# Patient Record
Sex: Female | Born: 1950 | Race: White | Hispanic: No | Smoking: Current every day smoker
Health system: Southern US, Community
[De-identification: ages and names within clinical notes are randomized; demographics above are authoritative.]

## PROBLEM LIST (undated history)

## (undated) DIAGNOSIS — E7800 Pure hypercholesterolemia, unspecified: Secondary | ICD-10-CM

## (undated) DIAGNOSIS — E039 Hypothyroidism, unspecified: Secondary | ICD-10-CM

## (undated) DIAGNOSIS — J189 Pneumonia, unspecified organism: Secondary | ICD-10-CM

## (undated) DIAGNOSIS — I10 Essential (primary) hypertension: Secondary | ICD-10-CM

## (undated) DIAGNOSIS — D352 Benign neoplasm of pituitary gland: Secondary | ICD-10-CM

## (undated) DIAGNOSIS — M797 Fibromyalgia: Secondary | ICD-10-CM

## (undated) DIAGNOSIS — G8929 Other chronic pain: Secondary | ICD-10-CM

## (undated) DIAGNOSIS — M549 Dorsalgia, unspecified: Secondary | ICD-10-CM

## (undated) DIAGNOSIS — I214 Non-ST elevation (NSTEMI) myocardial infarction: Secondary | ICD-10-CM

## (undated) HISTORY — PX: BACK SURGERY: SHX140

## (undated) HISTORY — PX: CARPAL TUNNEL RELEASE: SHX101

## (undated) HISTORY — PX: LUMBAR DISC SURGERY: SHX700

## (undated) HISTORY — PX: BREAST CYST EXCISION: SHX579

---
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
## 2009-09-13 HISTORY — PX: TRANSPHENOIDAL PITUITARY RESECTION: SHX2572

## 2018-12-14 ENCOUNTER — Inpatient Hospital Stay (HOSPITAL_COMMUNITY)
Admission: EM | Admit: 2018-12-14 | Discharge: 2018-12-16 | DRG: 247 | Disposition: A | Discharge status: Home / Self Care | Payer: Medicare HMO | Attending: Cardiology | Admitting: Cardiology

## 2018-12-14 ENCOUNTER — Encounter (HOSPITAL_COMMUNITY): Payer: Self-pay | Admitting: *Deleted

## 2018-12-14 ENCOUNTER — Emergency Department (HOSPITAL_COMMUNITY): Payer: Medicare HMO

## 2018-12-14 ENCOUNTER — Other Ambulatory Visit: Payer: Self-pay

## 2018-12-14 DIAGNOSIS — Z7952 Long term (current) use of systemic steroids: Secondary | ICD-10-CM

## 2018-12-14 DIAGNOSIS — Z886 Allergy status to analgesic agent status: Secondary | ICD-10-CM | POA: Diagnosis not present

## 2018-12-14 DIAGNOSIS — E237 Disorder of pituitary gland, unspecified: Secondary | ICD-10-CM | POA: Diagnosis not present

## 2018-12-14 DIAGNOSIS — F17210 Nicotine dependence, cigarettes, uncomplicated: Secondary | ICD-10-CM | POA: Diagnosis present

## 2018-12-14 DIAGNOSIS — Z7902 Long term (current) use of antithrombotics/antiplatelets: Secondary | ICD-10-CM

## 2018-12-14 DIAGNOSIS — E785 Hyperlipidemia, unspecified: Secondary | ICD-10-CM

## 2018-12-14 DIAGNOSIS — I2584 Coronary atherosclerosis due to calcified coronary lesion: Secondary | ICD-10-CM | POA: Diagnosis present

## 2018-12-14 DIAGNOSIS — Z8639 Personal history of other endocrine, nutritional and metabolic disease: Secondary | ICD-10-CM

## 2018-12-14 DIAGNOSIS — Z8249 Family history of ischemic heart disease and other diseases of the circulatory system: Secondary | ICD-10-CM

## 2018-12-14 DIAGNOSIS — Z720 Tobacco use: Secondary | ICD-10-CM | POA: Diagnosis not present

## 2018-12-14 DIAGNOSIS — Z955 Presence of coronary angioplasty implant and graft: Secondary | ICD-10-CM

## 2018-12-14 DIAGNOSIS — Z885 Allergy status to narcotic agent status: Secondary | ICD-10-CM

## 2018-12-14 DIAGNOSIS — Z79899 Other long term (current) drug therapy: Secondary | ICD-10-CM

## 2018-12-14 DIAGNOSIS — I2510 Atherosclerotic heart disease of native coronary artery without angina pectoris: Secondary | ICD-10-CM | POA: Diagnosis present

## 2018-12-14 DIAGNOSIS — I214 Non-ST elevation (NSTEMI) myocardial infarction: Secondary | ICD-10-CM | POA: Diagnosis present

## 2018-12-14 DIAGNOSIS — R001 Bradycardia, unspecified: Secondary | ICD-10-CM | POA: Diagnosis present

## 2018-12-14 DIAGNOSIS — Z79890 Hormone replacement therapy (postmenopausal): Secondary | ICD-10-CM | POA: Diagnosis not present

## 2018-12-14 DIAGNOSIS — I340 Nonrheumatic mitral (valve) insufficiency: Secondary | ICD-10-CM | POA: Diagnosis not present

## 2018-12-14 HISTORY — DX: Pure hypercholesterolemia, unspecified: E78.00

## 2018-12-14 HISTORY — DX: Pneumonia, unspecified organism: J18.9

## 2018-12-14 HISTORY — DX: Non-ST elevation (NSTEMI) myocardial infarction: I21.4

## 2018-12-14 HISTORY — DX: Other chronic pain: G89.29

## 2018-12-14 HISTORY — DX: Essential (primary) hypertension: I10

## 2018-12-14 HISTORY — DX: Fibromyalgia: M79.7

## 2018-12-14 HISTORY — DX: Hypothyroidism, unspecified: E03.9

## 2018-12-14 HISTORY — DX: Benign neoplasm of pituitary gland: D35.2

## 2018-12-14 HISTORY — DX: Dorsalgia, unspecified: M54.9

## 2018-12-14 LAB — BASIC METABOLIC PANEL
Anion gap: 11 (ref 5–15)
BUN: 12 mg/dL (ref 8–23)
CO2: 24 mmol/L (ref 22–32)
Calcium: 8.8 mg/dL — ABNORMAL LOW (ref 8.9–10.3)
Chloride: 98 mmol/L (ref 98–111)
Creatinine, Ser: 0.87 mg/dL (ref 0.44–1.00)
GFR calc Af Amer: >60 mL/min (ref >60)
Glucose, Bld: 107 mg/dL — ABNORMAL HIGH (ref 70–99)
Potassium: 4.2 mmol/L (ref 3.5–5.1)
SODIUM: 133 mmol/L — AB (ref 135–145)

## 2018-12-14 LAB — TROPONIN I: TROPONIN I: 12.43 ng/mL — AB (ref <0.03)

## 2018-12-14 LAB — PROTIME-INR
INR: 0.93
INR: 0.95
PROTHROMBIN TIME: 12.3 s (ref 11.4–15.2)
Prothrombin Time: 12.6 seconds (ref 11.4–15.2)

## 2018-12-14 LAB — CBC
HCT: 46.9 % — ABNORMAL HIGH (ref 36.0–46.0)
Hemoglobin: 15.8 g/dL — ABNORMAL HIGH (ref 12.0–15.0)
MCH: 30.2 pg (ref 26.0–34.0)
MCHC: 33.7 g/dL (ref 30.0–36.0)
MCV: 89.5 fL (ref 80.0–100.0)
Platelets: 325 10*3/uL (ref 150–400)
RBC: 5.24 MIL/uL — ABNORMAL HIGH (ref 3.87–5.11)
RDW: 13 % (ref 11.5–15.5)
WBC: 10.3 10*3/uL (ref 4.0–10.5)
nRBC: 0 % (ref 0.0–0.2)

## 2018-12-14 LAB — HEMOGLOBIN A1C
Hgb A1c MFr Bld: 5.2 % (ref 4.8–5.6)
Mean Plasma Glucose: 102.54 mg/dL

## 2018-12-14 LAB — APTT: APTT: 31 s (ref 24–36)

## 2018-12-14 MED ORDER — INFLUENZA VAC SPLIT HIGH-DOSE 0.5 ML IM SUSY
0.5000 mL | PREFILLED_SYRINGE | INTRAMUSCULAR | Status: DC
  Filled 2018-12-14 (×2): qty 0.5

## 2018-12-14 MED ORDER — ATORVASTATIN CALCIUM 80 MG PO TABS
80.0000 mg | ORAL_TABLET | Freq: Every day | ORAL | Status: DC
  Filled 2018-12-14: qty 1

## 2018-12-14 MED ORDER — DESMOPRESSIN ACETATE 0.1 MG PO TABS
0.1000 mg | ORAL_TABLET | Freq: Two times a day (BID) | ORAL | Status: DC
  Administered 2018-12-14 – 2018-12-16 (×4): 0.1 mg via ORAL
  Filled 2018-12-14 (×4): qty 1

## 2018-12-14 MED ORDER — LEVOTHYROXINE SODIUM 137 MCG PO TABS
137.0000 ug | ORAL_TABLET | ORAL | Status: DC
  Administered 2018-12-15 – 2018-12-16 (×2): 137 ug via ORAL
  Filled 2018-12-14 (×2): qty 1

## 2018-12-14 MED ORDER — PREDNISONE 1 MG PO TABS
4.0000 mg | ORAL_TABLET | Freq: Every day | ORAL | Status: DC
  Administered 2018-12-15 – 2018-12-16 (×2): 4 mg via ORAL
  Filled 2018-12-14 (×2): qty 4

## 2018-12-14 MED ORDER — NITROGLYCERIN IN D5W 200-5 MCG/ML-% IV SOLN
0.0000 ug/min | INTRAVENOUS | Status: DC
  Administered 2018-12-14: 5 ug/min via INTRAVENOUS
  Administered 2018-12-15: 10 ug/min via INTRAVENOUS
  Filled 2018-12-14: qty 250

## 2018-12-14 MED ORDER — VITAMIN B-12 1000 MCG PO TABS
1000.0000 ug | ORAL_TABLET | Freq: Every day | ORAL | Status: DC
  Administered 2018-12-16: 1000 ug via ORAL
  Filled 2018-12-14 (×2): qty 1

## 2018-12-14 MED ORDER — HEPARIN BOLUS VIA INFUSION
4000.0000 [IU] | Freq: Once | INTRAVENOUS | Status: AC
  Administered 2018-12-14: 4000 [IU] via INTRAVENOUS
  Filled 2018-12-14: qty 4000

## 2018-12-14 MED ORDER — LEVOTHYROXINE SODIUM 25 MCG PO TABS: 68.5000 ug | ORAL_TABLET | ORAL | Status: DC

## 2018-12-14 MED ORDER — NITROGLYCERIN 0.4 MG SL SUBL: 0.4000 mg | SUBLINGUAL_TABLET | SUBLINGUAL | Status: DC | PRN

## 2018-12-14 MED ORDER — ONDANSETRON HCL 4 MG/2ML IJ SOLN: 4.0000 mg | Freq: Four times a day (QID) | INTRAMUSCULAR | Status: DC | PRN

## 2018-12-14 MED ORDER — HEPARIN (PORCINE) 25000 UT/250ML-% IV SOLN
800.0000 [IU]/h | INTRAVENOUS | Status: DC
  Administered 2018-12-14: 800 [IU]/h via INTRAVENOUS
  Filled 2018-12-14: qty 250

## 2018-12-14 NOTE — Progress Notes (Signed)
ANTICOAGULATION CONSULT NOTE - Initial Consult  Pharmacy Consult for Heparin Indication: chest pain/ACS  Allergies  Allergen Reactions  . Aspirin   . Lortab [Hydrocodone-Acetaminophen]   . Percocet [Oxycodone-Acetaminophen]     Patient Measurements: Height: 5\' 1"  (154.9 cm) Weight: 154 lb (69.9 kg) IBW/kg (Calculated) : 47.8 Heparin Dosing Weight: 62.8 kg  Vital Signs: Temp: 97.9 F (36.6 C) (01/01 1950) Temp Source: Oral (01/01 1950) BP: 101/67 (01/01 2000) Pulse Rate: 69 (01/01 2000)  Labs: Recent Labs    12/14/18 1839  HGB 15.8*  HCT 46.9*  PLT 325  CREATININE 0.87  TROPONINI 12.43*    Estimated Creatinine Clearance: 56.8 mL/min (by C-G formula based on SCr of 0.87 mg/dL).   Medical History: Past Medical History:  Diagnosis Date  . Adenoma     Medications:  PTA medlist pending  Assessment: 68 yo F presented to ED with CP, + L arm radiation, since last PM.  Goal of Therapy:  Heparin level 0.3-0.7 units/ml Monitor platelets by anticoagulation protocol: Yes   Plan:  STAT aPTT, INR Heparin 4000 unit IV bolus x 1 Heparin infusion at 800 units/hr Heparin level in 6 hours Heparin level and CBC daily while on heparin Follow-up PTA medlist  Neeka Urista, Pharm.D., BCPS Clinical Pharmacist  **Pharmacist phone directory can now be found on [email or web address] ([email or web address]).  Listed under [place].  12/14/2018 8:11 PM

## 2018-12-14 NOTE — H&P (Addendum)
History & Physical    Patient ID: Bridget Navarro MRN: [id number], DOB/AGE: [birth year]   Admit date: 12/14/2018   Primary Physician: No primary care provider on file. Primary Cardiologist: No primary care provider on file.  Patient Profile    Bridget Navarro is a 68 year old woman with HL, long standing tobacco abuse, history of pituitary adenoma s/p resection who presents after sudden onset chest pain at 0230 this morning, found to have NSTEMI.   Past Medical History    Past Medical History:  Diagnosis Date  . Adenoma     History reviewed. No pertinent surgical history.   Allergies  Allergies  Allergen Reactions  . Aspirin Anaphylaxis, Shortness Of Breath and Swelling    Body and throat both swell  . Tape Hives, Rash and Other (See Comments)    NO CLEAR, "PLASTIC" TAPE!!! SKIN PEELED OFF AND THIS CAUSED BLISTERS!!!  . Codeine Itching and Other (See Comments)    Severe itching and makes the patient "hyper"  . Lortab [Hydrocodone-Acetaminophen] Itching and Other (See Comments)    Severe itching and makes the patient "hyper"  . Percocet [Oxycodone-Acetaminophen] Itching and Other (See Comments)    Severe itching and makes the patient "hyper"    History of Present Illness    Bridget Navarro is a 68 year old woman with HL, long standing tobacco abuse, history of pituitary adenoma s/p resection who presents after sudden onset chest pain at 0230 this morning, found to have NSTEMI.  The patient reports she has been in her usual state of health until she was woken from sleep at 0230 this morning due to >10/10 chest pressure. Patient reports it felt like someone was sitting on her chest. She denies any prior episodes of chest pain prior to today. No shortness of breath but does report diaphoresis, nausea and dizziness. Felt so unwell that she didn't think she could get out of bed. Pain eased off some during the day but due to ongoing 7/10 pain, she presented to urgent care today. At urgent  care, was noted that ECG showed inferior Q waves and thus she was sent to the [facility] ED.   Upon arrival, the patient was hemodynamically stable with HR 70, BP 105/70, chest pain 7/10 in intensity. ECG again showed inferior Q waves and sub 70mm, upsloping STE in lead III only. Laboratory evaluation significant for initial troponin I of 12.4. She was started on heparin gtt and nitro gtt. She was not given aspirin due to a history of hives and significant swelling that the patient reports with prior administration. At the time of my interview, patient reports that chest pain is significantly improved and is at 2/10 in intensity on 24mcg nitro infusion. She denies palpitations, shortness of breath, orthopnea, PND, LEE, syncope.  Home Medications    Prior to Admission medications   Medication Sig Start Date End Date Taking? Authorizing Provider  clopidogrel (PLAVIX) 75 MG tablet Take 75 mg by mouth daily.   Yes [provider]  Cranberry 425 MG CAPS Take 425 mg by mouth daily.   Yes [provider]  Cyanocobalamin (VITAMIN B12) 1000 MCG TBCR Take 1,000 mcg by mouth daily.    Yes [provider]  desmopressin (DDAVP) 0.1 MG tablet Take 0.1 mg by mouth 2 (two) times daily.   Yes [provider]  diclofenac (VOLTAREN) 75 MG EC tablet Take 75 mg by mouth 2 (two) times daily.   Yes [provider]  fexofenadine-pseudoephedrine (ALLEGRA-D) 60-120  MG 12 hr tablet Take 1 tablet by mouth at bedtime.   Yes [provider]  levothyroxine (SYNTHROID, LEVOTHROID) 137 MCG tablet Take 68.5-137 mcg by mouth See admin instructions. Take 137 mcg by mouth before breakfast on Sun/Tues/Wed/Thurs/Fri/Sat and 68.5 mcg on Mon   Yes [provider]  Melatonin 10 MG TABS Take 20 mg by mouth at bedtime.   Yes [provider]  pravastatin (PRAVACHOL) 20 MG tablet Take 20 mg by mouth daily.   Yes [provider]  predniSONE (DELTASONE) 1 MG tablet  Take 4 mg by mouth daily with breakfast.   Yes [provider]    Family History    Family History  Problem Relation Age of Onset  . Coronary artery disease Father   . Coronary artery disease Paternal Grandfather     Social History    Social History   Socioeconomic History  . Marital status: Not on file    Spouse name: Not on file  . Number of children: Not on file  . Years of education: Not on file  . Highest education level: Not on file  Occupational History  . Not on file  Social Needs  . Financial resource strain: Not on file  . Food insecurity:    Worry: Not on file    Inability: Not on file  . Transportation needs:    Medical: Not on file    Non-medical: Not on file  Tobacco Use  . Smoking status: Current Every Day Smoker    Packs/day: 1.00  . Smokeless tobacco: Never Used  Substance and Sexual Activity  . Alcohol use: Not on file  . Drug use: Not on file  . Sexual activity: Yes  Lifestyle  . Physical activity:    Days per week: Not on file    Minutes per session: Not on file  . Stress: Not on file  Relationships  . Social connections:    Talks on phone: Not on file    Gets together: Not on file    Attends religious service: Not on file    Active member of club or organization: Not on file    Attends meetings of clubs or organizations: Not on file    Relationship status: Not on file  . Intimate partner violence:    Fear of current or ex partner: Not on file    Emotionally abused: Not on file    Physically abused: Not on file    Forced sexual activity: Not on file  Other Topics Concern  . Not on file  Social History Narrative  . Not on file     Review of Systems    General:  No chills, fever, night sweats or weight changes.  Cardiovascular: As per HPI Dermatological: No rash, lesions/masses Respiratory: No cough, dyspnea Urologic: No hematuria, dysuria Abdominal:   No nausea, vomiting, diarrhea, bright red blood per rectum, melena,  or hematemesis Neurologic:  No visual changes, wkns, changes in mental status. All other systems reviewed and are otherwise negative except as noted above.  Physical Exam    Blood pressure 123/72, pulse 68, temperature 97.9 F (36.6 C), temperature source Oral, resp. rate (!) 22, height 5\' 1"  (1.549 m), weight 69.9 kg, SpO2 96 %.  General: Pleasant, NAD Psych: Normal affect. Neuro: Alert and oriented X 3. Moves all extremities spontaneously. HEENT: Normal  Neck: Supple without bruits or JVD. Lungs:  Resp regular and unlabored, CTA. Heart: RRR no s3, s4, or murmurs. Abdomen: Soft, non-tender,  non-distended, BS + x 4.  Extremities: No clubbing, cyanosis or edema. DP/PT/Radials 2+ and equal bilaterally.  Labs    Troponin (Point of Care Test) No results for input(s): TROPIPOC in the last 72 hours. Recent Labs    12/14/18 1839  TROPONINI 12.43*   Lab Results  Component Value Date   WBC 10.3 12/14/2018   HGB 15.8 (H) 12/14/2018   HCT 46.9 (H) 12/14/2018   MCV 89.5 12/14/2018   PLT 325 12/14/2018    Recent Labs  Lab 12/14/18 1839  NA 133*  K 4.2  CL 98  CO2 24  BUN 12  CREATININE 0.87  CALCIUM 8.8*  GLUCOSE 107*   No results found for: CHOL, HDL, LDLCALC, TRIG No results found for: [facility]   Radiology Studies    Dg Chest 2 View  Result Date: 12/14/2018 CLINICAL DATA:  Chest pain EXAM: CHEST - 2 VIEW COMPARISON:  None. FINDINGS: Normal heart size. Normal mediastinal contour. No pneumothorax. No pleural effusion. No pulmonary edema. Minimal scarring at anterior left lung base. No acute consolidative airspace disease. IMPRESSION: No active cardiopulmonary disease. Electronically Signed   By: Ilona Sorrel M.D.   On: 12/14/2018 19:34    ECG & Cardiac Imaging    Sinus rhythm. Normal rate. Inferior Q waves. Sub 1 mm, up sloping ST elevation in lead III only - personally reviewed.  Assessment & Plan    Bridget Navarro is a 68 year old woman with HL, long standing tobacco  abuse, history of pituitary adenoma s/p resection who presents after sudden onset chest pain at 0230 this morning, found to have NSTEMI.   # NSTEMI # HL Patient with typical description of chest pain and troponin significantly elevated consistent with NSTEMI. ECG without acute ST elevation but with chest pain ongoing at 2/10, on minimal dose of nitroglycerin. Discussed with patient the importance of notifying staff if worsening of pain. If recurrence of increased pain or increasing requirements for nitroglycerin would have low threshold for cardiac cath overnight tonight.  - Trend troponin, ECGs - Nitro gtt, currently at 5 mcg - heparin gtt - Cannot tolerate aspirin due to hives/swelling. Will need allergy consult for possible desensitization. - patient takes daily plavix at home, although for unclear indication previously; will continue at this time given inability to treat with aspirin. - TTE - Cardiac rehab - Tobacco cessation counseling - Hold on initiation of BB or ACEi in setting of borderline blood pressures in case titration of nitroglycerin necessary - Discontinue home pravastatin and start atovastatin 80mg  daily - NPO at midnight for cath in AM - Lipid panel, A1C, TSH pending  # History of pituitary adenoma s/p resection Will plan to continue home medications - Prednisone 4mg  daily - desmopressin 0.1mg  BID - synthroid with TSH as above  # FULL CODE   Signed, Bryna Colander, MD 12/14/2018, 10:41 PM  Addendum 11:50 PM: Patient now chest pain free, off nitroglycerin drip.  Addendum 530 AM: Routine ECG this AM now shows 29mm STE in II, III, AVF with some reciprocal depression in I and AVL. Checked on patient again and she reports no pain at all overnight. Slept all night without issue and remains chest pain free at this time. Discussed with Dr. Martinique, as patient remains chest pain free, will plan for first case this am.

## 2018-12-14 NOTE — ED Notes (Signed)
Patient denies chest pain at this time 

## 2018-12-14 NOTE — ED Triage Notes (Signed)
The pt arrived by [facility] from home pt c/o chest pain with lt arm and neck radiation since last pm  Iv per ems

## 2018-12-14 NOTE — ED Provider Notes (Signed)
[facility] EMERGENCY DEPARTMENT Provider Note   CSN: [id number] Arrival date & time: 12/14/18  1812     History   Chief Complaint Chief Complaint  Patient presents with  . Chest Pain    HPI Bridget Navarro is a 68 y.o. female otherwise healthy here presenting with chest pain.  Patient states that she has acute onset of left-sided chest pain radiate down her left and right arm starting around 2:30 AM.  Patient states that the pain is 7 out of 10.  It is a pressure-like sensation and felt like somebody sitting on her chest.  Associated with some shortness of breath as well.  Patient denies any recent travel or history of blood clots.  Patient states that her grandfather died in his 13s when she was 68 years old but she does not remember what he died of.  She does have some other family members that had heart disease with stents but has no personal history of heart disease. Patient sent from urgent care due to new Q waves on EKG.   The history is provided by the patient.    Past Medical History:  Diagnosis Date  . Adenoma     There are no active problems to display for this patient.   History reviewed. No pertinent surgical history.   OB History   No obstetric history on file.      Home Medications    Prior to Admission medications   Not on File    Family History No family history on file.  Social History Social History   Tobacco Use  . Smoking status: Current Every Day Smoker  . Smokeless tobacco: Never Used  Substance Use Topics  . Alcohol use: Not on file  . Drug use: Not on file     Allergies   Aspirin; Lortab [hydrocodone-acetaminophen]; and Percocet [oxycodone-acetaminophen]   Review of Systems Review of Systems  Cardiovascular: Positive for chest pain.  All other systems reviewed and are negative.    Physical Exam Updated Vital Signs BP 95/64   Pulse 71   Temp 97.9 F (36.6 C) (Oral)   Resp 15   Ht 5\' 1"  (1.549 m)   Wt  69.9 kg   SpO2 96%   BMI 29.10 kg/m   Physical Exam Vitals signs and nursing note reviewed.  Constitutional:      Comments: Uncomfortable   HENT:     Head: Normocephalic.  Eyes:     Pupils: Pupils are equal, round, and reactive to light.  Neck:     Musculoskeletal: Normal range of motion.  Cardiovascular:     Rate and Rhythm: Normal rate and regular rhythm.     Heart sounds: Normal heart sounds.  Pulmonary:     Effort: Pulmonary effort is normal.  Abdominal:     Palpations: Abdomen is soft.  Musculoskeletal: Normal range of motion.  Skin:    General: Skin is warm.     Capillary Refill: Capillary refill takes less than 2 seconds.  Neurological:     General: No focal deficit present.     Mental Status: She is alert.  Psychiatric:        Mood and Affect: Mood normal.        Behavior: Behavior normal.      ED Treatments / Results  Labs (all labs ordered are listed, but only abnormal results are displayed) Labs Reviewed  BASIC METABOLIC PANEL - Abnormal; Notable for the following components:  Result Value   Sodium 133 (*)    Glucose, Bld 107 (*)    Calcium 8.8 (*)    All other components within normal limits  CBC - Abnormal; Notable for the following components:   RBC 5.24 (*)    Hemoglobin 15.8 (*)    HCT 46.9 (*)    All other components within normal limits  TROPONIN I - Abnormal; Notable for the following components:   Troponin I 12.43 (*)    All other components within normal limits  APTT  PROTIME-INR  HEPARIN LEVEL (UNFRACTIONATED)  CBC    EKG EKG Interpretation  Date/Time:  Wednesday December 14 2018 19:54:23 EST Ventricular Rate:  69 PR Interval:  172 QRS Duration: 103 QT Interval:  458 QTC Calculation: 491 R Axis:   62 Text Interpretation:  Sinus rhythm Abnormal inferior Q waves Borderline prolonged QT interval No significant change since last tracing Confirmed by Wandra Arthurs [phone]) on 12/14/2018 7:56:51 PM   Radiology Dg Chest 2  View  Result Date: 12/14/2018 CLINICAL DATA:  Chest pain EXAM: CHEST - 2 VIEW COMPARISON:  None. FINDINGS: Normal heart size. Normal mediastinal contour. No pneumothorax. No pleural effusion. No pulmonary edema. Minimal scarring at anterior left lung base. No acute consolidative airspace disease. IMPRESSION: No active cardiopulmonary disease. Electronically Signed   By: Ilona Sorrel M.D.   On: 12/14/2018 19:34    Procedures Procedures (including critical care time)  CRITICAL CARE Performed by: Wandra Arthurs   Total critical care time: 30 minutes  Critical care time was exclusive of separately billable procedures and treating other patients.  Critical care was necessary to treat or prevent imminent or life-threatening deterioration.  Critical care was time spent personally by me on the following activities: development of treatment plan with patient and/or surrogate as well as nursing, discussions with consultants, evaluation of patient's response to treatment, examination of patient, obtaining history from patient or surrogate, ordering and performing treatments and interventions, ordering and review of laboratory studies, ordering and review of radiographic studies, pulse oximetry and re-evaluation of patient's condition.   Medications Ordered in ED Medications  nitroGLYCERIN (NITROSTAT) SL tablet 0.4 mg (has no administration in time range)  nitroGLYCERIN 50 mg in dextrose 5 % 250 mL (0.2 mg/mL) infusion (5 mcg/min Intravenous New Bag/Given 12/14/18 2021)  heparin bolus via infusion 4,000 Units (4,000 Units Intravenous Bolus from Bag 12/14/18 2046)    Followed by  heparin ADULT infusion 100 units/mL (25000 units/22mL sodium chloride 0.45%) (800 Units/hr Intravenous New Bag/Given 12/14/18 2045)     Initial Impression / Assessment and Plan / ED Course  I have reviewed the triage vital signs and the nursing notes.  Pertinent labs & imaging results that were available during my care of the  patient were reviewed by me and considered in my medical decision making (see chart for details).    Bridget Navarro is a 68 y.o. female here with chest pain.  Patient does have inferior Q waves and there are no old EKGs.  History is concerned for unstable angina or NSTEMI. Will get labs, trop, CXR. I doubt dissection or PE.   8:30 pm Trop elevated at 12. Still has chest pressure. Started on nitro and heparin drips. Consulted cardiology to admit. No obvious STEMI on EKG. Will admit for NSTEMI.    Final Clinical Impressions(s) / ED Diagnoses   Final diagnoses:  None    ED Discharge Orders    None       Darl Householder,  Lujean Rave, MD 12/14/18 [phone]

## 2018-12-14 NOTE — ED Notes (Signed)
Attempted report x1. 

## 2018-12-15 ENCOUNTER — Inpatient Hospital Stay (HOSPITAL_COMMUNITY): Payer: Medicare HMO

## 2018-12-15 ENCOUNTER — Encounter (HOSPITAL_COMMUNITY)
Admission: EM | Disposition: A | Discharge status: Home / Self Care | Payer: Self-pay | Source: Home / Self Care | Attending: Cardiology

## 2018-12-15 DIAGNOSIS — E785 Hyperlipidemia, unspecified: Secondary | ICD-10-CM

## 2018-12-15 DIAGNOSIS — I340 Nonrheumatic mitral (valve) insufficiency: Secondary | ICD-10-CM

## 2018-12-15 DIAGNOSIS — Z720 Tobacco use: Secondary | ICD-10-CM

## 2018-12-15 DIAGNOSIS — I2510 Atherosclerotic heart disease of native coronary artery without angina pectoris: Secondary | ICD-10-CM

## 2018-12-15 DIAGNOSIS — I214 Non-ST elevation (NSTEMI) myocardial infarction: Principal | ICD-10-CM

## 2018-12-15 DIAGNOSIS — E237 Disorder of pituitary gland, unspecified: Secondary | ICD-10-CM

## 2018-12-15 HISTORY — PX: CORONARY BALLOON ANGIOPLASTY: CATH118233

## 2018-12-15 HISTORY — PX: LEFT HEART CATH AND CORONARY ANGIOGRAPHY: CATH118249

## 2018-12-15 HISTORY — PX: ULTRASOUND GUIDANCE FOR VASCULAR ACCESS: SHX6516

## 2018-12-15 HISTORY — PX: CORONARY STENT INTERVENTION: CATH118234

## 2018-12-15 LAB — CBC
HCT: 43.9 % (ref 36.0–46.0)
Hemoglobin: 14.8 g/dL (ref 12.0–15.0)
MCH: 29.7 pg (ref 26.0–34.0)
MCHC: 33.7 g/dL (ref 30.0–36.0)
MCV: 88 fL (ref 80.0–100.0)
Platelets: 264 10*3/uL (ref 150–400)
RBC: 4.99 MIL/uL (ref 3.87–5.11)
RDW: 13 % (ref 11.5–15.5)
WBC: 10.5 10*3/uL (ref 4.0–10.5)
nRBC: 0 % (ref 0.0–0.2)

## 2018-12-15 LAB — BASIC METABOLIC PANEL
Anion gap: 12 (ref 5–15)
BUN: 12 mg/dL (ref 8–23)
CO2: 19 mmol/L — ABNORMAL LOW (ref 22–32)
Calcium: 8.6 mg/dL — ABNORMAL LOW (ref 8.9–10.3)
Chloride: 102 mmol/L (ref 98–111)
Creatinine, Ser: 0.9 mg/dL (ref 0.44–1.00)
GFR calc Af Amer: >60 mL/min (ref >60)
GFR calc non Af Amer: >60 mL/min (ref >60)
Glucose, Bld: 83 mg/dL (ref 70–99)
Potassium: 3.8 mmol/L (ref 3.5–5.1)
Sodium: 133 mmol/L — ABNORMAL LOW (ref 135–145)

## 2018-12-15 LAB — COMPREHENSIVE METABOLIC PANEL
ALT: 14 U/L (ref 0–44)
AST: 49 U/L — ABNORMAL HIGH (ref 15–41)
Albumin: 3.8 g/dL (ref 3.5–5.0)
Alkaline Phosphatase: 75 U/L (ref 38–126)
Anion gap: 13 (ref 5–15)
BUN: 16 mg/dL (ref 8–23)
CO2: 21 mmol/L — ABNORMAL LOW (ref 22–32)
Calcium: 8.6 mg/dL — ABNORMAL LOW (ref 8.9–10.3)
Chloride: 99 mmol/L (ref 98–111)
Creatinine, Ser: 0.85 mg/dL (ref 0.44–1.00)
GFR calc Af Amer: >60 mL/min (ref >60)
GFR calc non Af Amer: >60 mL/min (ref >60)
Glucose, Bld: 91 mg/dL (ref 70–99)
Potassium: 3.9 mmol/L (ref 3.5–5.1)
Sodium: 133 mmol/L — ABNORMAL LOW (ref 135–145)
Total Bilirubin: 0.4 mg/dL (ref 0.3–1.2)
Total Protein: 6.5 g/dL (ref 6.5–8.1)

## 2018-12-15 LAB — LIPID PANEL
Cholesterol: 205 mg/dL — ABNORMAL HIGH (ref 0–200)
HDL: 39 mg/dL — ABNORMAL LOW (ref >40)
LDL Cholesterol: 124 mg/dL — ABNORMAL HIGH (ref 0–99)
Total CHOL/HDL Ratio: 5.3 RATIO
Triglycerides: 210 mg/dL — ABNORMAL HIGH (ref <150)
VLDL: 42 mg/dL — ABNORMAL HIGH (ref 0–40)

## 2018-12-15 LAB — TROPONIN I
Troponin I: 14.49 ng/mL (ref <0.03)
Troponin I: 7.47 ng/mL (ref <0.03)
Troponin I: 5.83 ng/mL (ref <0.03)

## 2018-12-15 LAB — POCT ACTIVATED CLOTTING TIME
Activated Clotting Time: 268 seconds
Activated Clotting Time: 296 seconds
Activated Clotting Time: 307 seconds

## 2018-12-15 LAB — ECHOCARDIOGRAM COMPLETE
Height: 63 in
Weight: 2444.8 oz

## 2018-12-15 LAB — HIV ANTIBODY (ROUTINE TESTING W REFLEX): HIV Screen 4th Generation wRfx: NONREACTIVE

## 2018-12-15 LAB — HEPARIN LEVEL (UNFRACTIONATED): HEPARIN UNFRACTIONATED: 0.45 [IU]/mL (ref 0.30–0.70)

## 2018-12-15 LAB — TSH: TSH: <0.01 u[IU]/mL — ABNORMAL LOW (ref 0.350–4.500)

## 2018-12-15 LAB — MAGNESIUM: Magnesium: 2 mg/dL (ref 1.7–2.4)

## 2018-12-15 LAB — T4, FREE: Free T4: 1.31 ng/dL (ref 0.82–1.77)

## 2018-12-15 SURGERY — LEFT HEART CATH AND CORONARY ANGIOGRAPHY
Anesthesia: LOCAL

## 2018-12-15 MED ORDER — SODIUM CHLORIDE 0.9 % WEIGHT BASED INFUSION
1.0000 mL/kg/h | INTRAVENOUS | Status: DC
  Administered 2018-12-15: 1 mL/kg/h via INTRAVENOUS

## 2018-12-15 MED ORDER — FENTANYL CITRATE (PF) 100 MCG/2ML IJ SOLN
INTRAMUSCULAR | Status: AC
  Filled 2018-12-15: qty 2

## 2018-12-15 MED ORDER — SODIUM CHLORIDE 0.9 % IV SOLN: 250.0000 mL | INTRAVENOUS | Status: DC | PRN

## 2018-12-15 MED ORDER — NITROGLYCERIN 1 MG/10 ML FOR IR/CATH LAB
INTRA_ARTERIAL | Status: DC | PRN
  Administered 2018-12-15 (×2): 200 ug via INTRACORONARY
  Administered 2018-12-15: 100 ug via INTRACORONARY
  Administered 2018-12-15: 200 ug via INTRACORONARY

## 2018-12-15 MED ORDER — CLOPIDOGREL BISULFATE 75 MG PO TABS
75.0000 mg | ORAL_TABLET | Freq: Every day | ORAL | Status: DC
  Administered 2018-12-15: 75 mg via ORAL
  Filled 2018-12-15: qty 1

## 2018-12-15 MED ORDER — NITROGLYCERIN IN D5W 200-5 MCG/ML-% IV SOLN: 0.0000 ug/min | INTRAVENOUS | Status: DC

## 2018-12-15 MED ORDER — SODIUM CHLORIDE 0.9% FLUSH
3.0000 mL | Freq: Two times a day (BID) | INTRAVENOUS | Status: DC
  Administered 2018-12-15 (×2): 3 mL via INTRAVENOUS

## 2018-12-15 MED ORDER — HYDRALAZINE HCL 20 MG/ML IJ SOLN: 5.0000 mg | INTRAMUSCULAR | Status: AC | PRN

## 2018-12-15 MED ORDER — LIDOCAINE HCL (PF) 1 % IJ SOLN
INTRAMUSCULAR | Status: DC | PRN
  Administered 2018-12-15: 2 mL

## 2018-12-15 MED ORDER — MIDAZOLAM HCL 2 MG/2ML IJ SOLN
INTRAMUSCULAR | Status: DC | PRN
  Administered 2018-12-15 (×4): 1 mg via INTRAVENOUS

## 2018-12-15 MED ORDER — FENTANYL CITRATE (PF) 100 MCG/2ML IJ SOLN
INTRAMUSCULAR | Status: DC | PRN
  Administered 2018-12-15 (×4): 25 ug via INTRAVENOUS

## 2018-12-15 MED ORDER — SODIUM CHLORIDE 0.9% FLUSH: 3.0000 mL | INTRAVENOUS | Status: DC | PRN

## 2018-12-15 MED ORDER — SODIUM CHLORIDE 0.9 % IV SOLN: INTRAVENOUS | Status: AC

## 2018-12-15 MED ORDER — SODIUM CHLORIDE 0.9 % WEIGHT BASED INFUSION
3.0000 mL/kg/h | INTRAVENOUS | Status: DC
  Administered 2018-12-15 (×2): 3 mL/kg/h via INTRAVENOUS

## 2018-12-15 MED ORDER — VERAPAMIL HCL 2.5 MG/ML IV SOLN
INTRAVENOUS | Status: DC | PRN
  Administered 2018-12-15 (×2): 10 mL via INTRA_ARTERIAL

## 2018-12-15 MED ORDER — MIDAZOLAM HCL 2 MG/2ML IJ SOLN
INTRAMUSCULAR | Status: AC
  Filled 2018-12-15: qty 2

## 2018-12-15 MED ORDER — LIDOCAINE HCL (PF) 1 % IJ SOLN
INTRAMUSCULAR | Status: AC
  Filled 2018-12-15: qty 30

## 2018-12-15 MED ORDER — SODIUM CHLORIDE 0.9% FLUSH: 3.0000 mL | Freq: Two times a day (BID) | INTRAVENOUS | Status: DC

## 2018-12-15 MED ORDER — THE SENSUOUS HEART BOOK
Freq: Once | Status: AC
  Administered 2018-12-15: 22:00:00
  Filled 2018-12-15: qty 1

## 2018-12-15 MED ORDER — IOHEXOL 350 MG/ML SOLN
INTRAVENOUS | Status: DC | PRN
  Administered 2018-12-15: 160 mL via INTRAVENOUS

## 2018-12-15 MED ORDER — HEPARIN SODIUM (PORCINE) 1000 UNIT/ML IJ SOLN
INTRAMUSCULAR | Status: DC | PRN
  Administered 2018-12-15: 3500 [IU] via INTRAVENOUS
  Administered 2018-12-15: 2000 [IU] via INTRAVENOUS
  Administered 2018-12-15: 3500 [IU] via INTRAVENOUS

## 2018-12-15 MED ORDER — HEPARIN (PORCINE) IN NACL 1000-0.9 UT/500ML-% IV SOLN
INTRAVENOUS | Status: DC | PRN
  Administered 2018-12-15 (×2): 500 mL

## 2018-12-15 MED ORDER — HEPARIN SODIUM (PORCINE) 1000 UNIT/ML IJ SOLN
INTRAMUSCULAR | Status: AC
  Filled 2018-12-15: qty 1

## 2018-12-15 MED ORDER — TICAGRELOR 90 MG PO TABS
90.0000 mg | ORAL_TABLET | Freq: Two times a day (BID) | ORAL | Status: DC
  Administered 2018-12-15 – 2018-12-16 (×2): 90 mg via ORAL
  Filled 2018-12-15 (×2): qty 1

## 2018-12-15 MED ORDER — VERAPAMIL HCL 2.5 MG/ML IV SOLN
INTRAVENOUS | Status: AC
  Filled 2018-12-15: qty 2

## 2018-12-15 MED ORDER — ENOXAPARIN SODIUM 40 MG/0.4ML ~~LOC~~ SOLN
40.0000 mg | SUBCUTANEOUS | Status: DC
  Administered 2018-12-16: 08:00:00 40 mg via SUBCUTANEOUS
  Filled 2018-12-15: qty 0.4

## 2018-12-15 MED ORDER — ANGIOPLASTY BOOK
Freq: Once | Status: AC
  Administered 2018-12-15: 17:00:00
  Filled 2018-12-15: qty 1

## 2018-12-15 MED ORDER — HEPARIN (PORCINE) IN NACL 1000-0.9 UT/500ML-% IV SOLN
INTRAVENOUS | Status: AC
  Filled 2018-12-15: qty 1000

## 2018-12-15 MED ORDER — HEART ATTACK BOUNCING BOOK
Freq: Once | Status: AC
  Administered 2018-12-15: 22:00:00
  Filled 2018-12-15: qty 1

## 2018-12-15 MED ORDER — LABETALOL HCL 5 MG/ML IV SOLN: 10.0000 mg | INTRAVENOUS | Status: AC | PRN

## 2018-12-15 MED ORDER — TICAGRELOR 90 MG PO TABS
ORAL_TABLET | ORAL | Status: DC | PRN
  Administered 2018-12-15: 180 mg via ORAL

## 2018-12-15 MED ORDER — TICAGRELOR 90 MG PO TABS
ORAL_TABLET | ORAL | Status: AC
  Filled 2018-12-15: qty 2

## 2018-12-15 SURGICAL SUPPLY — 21 items
BALLN SAPPHIRE 2.0X12 (BALLOONS) ×3
BALLN SAPPHIRE ~~LOC~~ 2.75X18 (BALLOONS) ×1 IMPLANT
BALLOON SAPPHIRE 2.0X12 (BALLOONS) IMPLANT
CATH 5FR JL3.5 JR4 ANG PIG MP (CATHETERS) ×1 IMPLANT
CATH LAUNCHER 6FR JR4 (CATHETERS) ×1 IMPLANT
CATH VISTA GUIDE 6FR XB3 (CATHETERS) ×1 IMPLANT
DEVICE RAD COMP TR BAND LRG (VASCULAR PRODUCTS) ×1 IMPLANT
GLIDESHEATH SLEND SS 6F .021 (SHEATH) ×1 IMPLANT
GUIDEWIRE INQWIRE 1.5J.035X260 (WIRE) IMPLANT
INQWIRE 1.5J .035X260CM (WIRE) ×3
KIT ENCORE 26 ADVANTAGE (KITS) ×1 IMPLANT
KIT HEART LEFT (KITS) ×3 IMPLANT
PACK CARDIAC CATHETERIZATION (CUSTOM PROCEDURE TRAY) ×3 IMPLANT
SHEATH PROBE COVER 6X72 (BAG) ×1 IMPLANT
STENT SYNERGY DES 2.5X32 (Permanent Stent) ×1 IMPLANT
SYR MEDRAD MARK 7 150ML (SYRINGE) ×1 IMPLANT
TRANSDUCER W/STOPCOCK (MISCELLANEOUS) ×3 IMPLANT
TUBING CIL FLEX 10 FLL-RA (TUBING) ×3 IMPLANT
WIRE HI TORQ BMW 190CM (WIRE) ×1 IMPLANT
WIRE MINAMO 190 (WIRE) ×1 IMPLANT
WIRE PT2 MS 185 (WIRE) ×1 IMPLANT

## 2018-12-15 NOTE — H&P (View-Only) (Signed)
Progress Note  Patient Name: Bridget Navarro Date of Encounter: 12/15/2018  Primary Cardiologist: No primary care provider on file.   Subjective   Patient continues to report some mild chest pain that she ranks as a 2/10 on the pain scale. She denies any shortness of breath.   Patient is scheduled for left heart catheterization this morning. Explained procedure and answered patient's questions.  Inpatient Medications    Scheduled Meds: . atorvastatin  80 mg Oral q1800  . clopidogrel  75 mg Oral Daily  . desmopressin  0.1 mg Oral BID  . Influenza vac split quadrivalent PF  0.5 mL Intramuscular Tomorrow-1000  . levothyroxine  137 mcg Oral Once per day on Sun Tue Wed Thu Fri Sat  . [START ON 12/19/2018] levothyroxine  68.5 mcg Oral Once per day on Mon  . predniSONE  4 mg Oral Q breakfast  . vitamin B-12  1,000 mcg Oral Daily   Continuous Infusions: . heparin 800 Units/hr (12/14/18 2045)  . nitroGLYCERIN Stopped (12/14/18 2106)   PRN Meds: nitroGLYCERIN, ondansetron (ZOFRAN) IV   Vital Signs    Vitals:   12/14/18 2200 12/14/18 2239 12/15/18 0059 12/15/18 0452  BP: 123/72 (!) 129/92 (!) 131/56 121/75  Pulse: 68 67 63 60  Resp: (!) 22 (!) 21 16 17   Temp:  (!) 97.5 F (36.4 C) 98.1 F (36.7 C) 98.2 F (36.8 C)  TempSrc:  Oral Oral Oral  SpO2: 96% 98% 97% 93%  Weight:  69.3 kg    Height:  5\' 3"  (1.6 m)      Intake/Output Summary (Last 24 hours) at 12/15/2018 0718 Last data filed at 12/15/2018 0610 Gross per 24 hour  Intake 234.52 ml  Output -  Net 234.52 ml   Filed Weights   12/14/18 1827 12/14/18 1831 12/14/18 2239  Weight: 69.9 kg 69.9 kg 69.3 kg    Telemetry    Sinus rhythm with heart rates in the high 50's to 70's. - Personally Reviewed  ECG    Sinus bradycardia, rate 59 bpm, ST elevation in inferior leads. - Personally Reviewed  Physical Exam   GEN: Caucasian female resting comfortably. Alert and in no acute distress.   Neck: Supple. Cardiac: RRR. No  murmurs, rubs, or gallops.  Respiratory: Clear to auscultation bilaterally. No wheezes, rhonchi, or rales.  GI: Abdomen soft, non-distended, and non-tender to palpation. Bowel sounds present. Extremities: No lower extremity edema. Soft radial pulses bilaterally.  Skin: Warm and dry. Neuro:  No focal deficits. Psych: Normal affect.  Labs    Chemistry Recent Labs  Lab 12/14/18 1839 12/14/18 2318 12/15/18 0439  NA 133* 133* 133*  K 4.2 3.9 3.8  CL 98 99 102  CO2 24 21* 19*  GLUCOSE 107* 91 83  BUN 12 16 12   CREATININE 0.87 0.85 0.90  CALCIUM 8.8* 8.6* 8.6*  PROT  --  6.5  --   ALBUMIN  --  3.8  --   AST  --  49*  --   ALT  --  14  --   ALKPHOS  --  75  --   BILITOT  --  0.4  --   GFRNONAA >60 >60 >60  GFRAA >60 >60 >60  ANIONGAP 11 13 12      Hematology Recent Labs  Lab 12/14/18 1839 12/15/18 0439  WBC 10.3 10.5  RBC 5.24* 4.99  HGB 15.8* 14.8  HCT 46.9* 43.9  MCV 89.5 88.0  MCH 30.2 29.7  MCHC 33.7 33.7  RDW  13.0 13.0  PLT 325 264    Cardiac Enzymes Recent Labs  Lab 12/14/18 1839 12/14/18 2318 12/15/18 0439  TROPONINI 12.43* 14.49* 7.47*   No results for input(s): TROPIPOC in the last 168 hours.   BNPNo results for input(s): BNP, PROBNP in the last 168 hours.   DDimer No results for input(s): DDIMER in the last 168 hours.   Radiology    Dg Chest 2 View  Result Date: 12/14/2018 CLINICAL DATA:  Chest pain EXAM: CHEST - 2 VIEW COMPARISON:  None. FINDINGS: Normal heart size. Normal mediastinal contour. No pneumothorax. No pleural effusion. No pulmonary edema. Minimal scarring at anterior left lung base. No acute consolidative airspace disease. IMPRESSION: No active cardiopulmonary disease. Electronically Signed   By: Ilona Sorrel M.D.   On: 12/14/2018 19:34    Cardiac Studies   None.  Patient Profile   Bridget Navarro is a 68 y.o. female with a history of hyperlipidemia, pituitary adenoma s/p resection, and long-standing tobacco abuse, who presented on  12/14/2018 for evaluation of sudden onset of chest pain and was found to have an NSTEMI.  Assessment & Plan    NSTEMI - Troponin peaked at 14.49.  - Initial EKG in the ED showed inferior Q waves and 29mm upsloping ST elevation in lead III. - Repeat EKG this morning showed ST elevations in all inferior leads (II, III, and aVF). - Patient continues to report mild chest pain that she ranks as 2/10 on the pain scale. - Continue IV Heparin and IV Nitroglycerin.  - Patient takes daily Plavix at home although for unclear indication. - Aspirin not initiated due to previous allergic reaction with hives and swelling.   - Continue statin.  - Will likely need to add low dose beta-blocker following catheterization. Patient has been mildly bradycardic at times.  - Echo pending. - Patient is scheduled for a left heart catheterization later today. Orders placed. The patient understands that risks include but are not limited to stroke (1 in 1000), death (1 in 47), kidney failure [usually temporary] (1 in 500), bleeding (1 in 200), allergic reaction [possibly serious] (1 in 200), and agrees to proceed.   Hyperlipidemia - Take Pravastatin 20mg  daily at home. This was discontinued at admission in favor of high-intensity statin. - Lipid panel this admission: Cholesterol 205, Triglycerides 210, HDL 39, LDL 124.  - Continue Lipitor 80mg  daily.   History of Pituitary Adenoma s/p Resection - Will continue home medications: Prednisone, Desmopressin, and Synthroid.   Tobacco Abuse - Complete cessation is advised.   For questions or updates, please contact [place] Please consult [email or web address] for contact info under        Signed, Darreld Mclean, PA-C  12/15/2018, 7:18 AM      Patient seen and examined. Agree with assessment and plan.  Bridget Navarro is a 68 year old female who has a 48-year history of tobacco use, a history of hyperlipidemia, history of prior pituitary adenoma, status post resection  in 1991, and developed new onset severe substernal chest discomfort  awaking her from sleep on 12/14/2018.  She presented last evening to the emergency room.  Initial ECG showed mild inferior upsloping ST changes  Initial troponin 12.4.  She was evaluated by the fellow and felt to have a NSTEM. In ER she was started on low-dose nitroglycerin infusion and apparently chest pain resolved and in the ER nitroglycerin later was discontinued.  Presently on 4 E., she has mild residual chest discomfort.  I have reinstituted IV nitroglycerin  at 10 ug.  ECG this am shows small Q wave in lead III with 1 mm ST elevation in the inferior leads. Subsequent troponin 14.49 and 7.47. Suspect RCA culprit vessel.  I have discussed need for cardiac catheterization. I have reviewed the risks, indications, and alternatives to cardiac catheterization, possible angioplasty, and stenting with the patient. Risks include but are not limited to bleeding, infection, vascular injury, stroke, myocardial infection, arrhythmia, kidney injury, radiation-related injury in the case of prolonged fluoroscopy use, emergency cardiac surgery, and death. The patient understands the risks of serious complication is 1-2 in 6815 with diagnostic cardiac cath and 1-2% or less with angioplasty/stenting.  Patient agrees to undergo the procedure.  She will be worked in as the next case.  She has a very poor right radial pulse.  Femoral access may be necessary.   Troy Sine, MD, [facility] 12/15/2018 8:22 AM

## 2018-12-15 NOTE — Care Management (Signed)
1440 12-15-17 Bridget Pitstick, RN,BSN [phone] CM did speak with patient and she has [employer] Medicare- sister to bring card tonight. Staff RN to make a copy of card and place on shadow chart. Patient uses [facility] in [place]. Medications to be sent to [facility] once patient is stable to transition home. No further needs from CM at this time.

## 2018-12-15 NOTE — Progress Notes (Signed)
  Echocardiogram 2D Echocardiogram has been performed.  Damont Balles L Androw 12/15/2018, 2:39 PM

## 2018-12-15 NOTE — Progress Notes (Addendum)
Text page Dr. Durene Romans  About abnl. E.K.G. call return immed. And She is on her way up to see patient. Charge Nurse Butch Penny also aware. Patient voicing no complaints

## 2018-12-15 NOTE — Progress Notes (Signed)
Nitro start @10mcg /min per Dr. Kelly@bedside .

## 2018-12-15 NOTE — Progress Notes (Addendum)
Progress Note  Patient Name: Bridget Navarro Date of Encounter: 12/15/2018  Primary Cardiologist: No primary care provider on file.   Subjective   Patient continues to report some mild chest pain that she ranks as a 2/10 on the pain scale. She denies any shortness of breath.   Patient is scheduled for left heart catheterization this morning. Explained procedure and answered patient's questions.  Inpatient Medications    Scheduled Meds: . atorvastatin  80 mg Oral q1800  . clopidogrel  75 mg Oral Daily  . desmopressin  0.1 mg Oral BID  . Influenza vac split quadrivalent PF  0.5 mL Intramuscular Tomorrow-1000  . levothyroxine  137 mcg Oral Once per day on Sun Tue Wed Thu Fri Sat  . [START ON 12/19/2018] levothyroxine  68.5 mcg Oral Once per day on Mon  . predniSONE  4 mg Oral Q breakfast  . vitamin B-12  1,000 mcg Oral Daily   Continuous Infusions: . heparin 800 Units/hr (12/14/18 2045)  . nitroGLYCERIN Stopped (12/14/18 2106)   PRN Meds: nitroGLYCERIN, ondansetron (ZOFRAN) IV   Vital Signs    Vitals:   12/14/18 2200 12/14/18 2239 12/15/18 0059 12/15/18 0452  BP: 123/72 (!) 129/92 (!) 131/56 121/75  Pulse: 68 67 63 60  Resp: (!) 22 (!) 21 16 17   Temp:  (!) 97.5 F (36.4 C) 98.1 F (36.7 C) 98.2 F (36.8 C)  TempSrc:  Oral Oral Oral  SpO2: 96% 98% 97% 93%  Weight:  69.3 kg    Height:  5\' 3"  (1.6 m)      Intake/Output Summary (Last 24 hours) at 12/15/2018 0718 Last data filed at 12/15/2018 0610 Gross per 24 hour  Intake 234.52 ml  Output -  Net 234.52 ml   Filed Weights   12/14/18 1827 12/14/18 1831 12/14/18 2239  Weight: 69.9 kg 69.9 kg 69.3 kg    Telemetry    Sinus rhythm with heart rates in the high 50's to 70's. - Personally Reviewed  ECG    Sinus bradycardia, rate 59 bpm, ST elevation in inferior leads. - Personally Reviewed  Physical Exam   GEN: Caucasian female resting comfortably. Alert and in no acute distress.   Neck: Supple. Cardiac: RRR. No  murmurs, rubs, or gallops.  Respiratory: Clear to auscultation bilaterally. No wheezes, rhonchi, or rales.  GI: Abdomen soft, non-distended, and non-tender to palpation. Bowel sounds present. Extremities: No lower extremity edema. Soft radial pulses bilaterally.  Skin: Warm and dry. Neuro:  No focal deficits. Psych: Normal affect.  Labs    Chemistry Recent Labs  Lab 12/14/18 1839 12/14/18 2318 12/15/18 0439  NA 133* 133* 133*  K 4.2 3.9 3.8  CL 98 99 102  CO2 24 21* 19*  GLUCOSE 107* 91 83  BUN 12 16 12   CREATININE 0.87 0.85 0.90  CALCIUM 8.8* 8.6* 8.6*  PROT  --  6.5  --   ALBUMIN  --  3.8  --   AST  --  49*  --   ALT  --  14  --   ALKPHOS  --  75  --   BILITOT  --  0.4  --   GFRNONAA >60 >60 >60  GFRAA >60 >60 >60  ANIONGAP 11 13 12      Hematology Recent Labs  Lab 12/14/18 1839 12/15/18 0439  WBC 10.3 10.5  RBC 5.24* 4.99  HGB 15.8* 14.8  HCT 46.9* 43.9  MCV 89.5 88.0  MCH 30.2 29.7  MCHC 33.7 33.7  RDW  13.0 13.0  PLT 325 264    Cardiac Enzymes Recent Labs  Lab 12/14/18 1839 12/14/18 2318 12/15/18 0439  TROPONINI 12.43* 14.49* 7.47*   No results for input(s): TROPIPOC in the last 168 hours.   BNPNo results for input(s): BNP, PROBNP in the last 168 hours.   DDimer No results for input(s): DDIMER in the last 168 hours.   Radiology    Dg Chest 2 View  Result Date: 12/14/2018 CLINICAL DATA:  Chest pain EXAM: CHEST - 2 VIEW COMPARISON:  None. FINDINGS: Normal heart size. Normal mediastinal contour. No pneumothorax. No pleural effusion. No pulmonary edema. Minimal scarring at anterior left lung base. No acute consolidative airspace disease. IMPRESSION: No active cardiopulmonary disease. Electronically Signed   By: Ilona Sorrel M.D.   On: 12/14/2018 19:34    Cardiac Studies   None.  Patient Profile   Bridget Navarro is a 68 y.o. female with a history of hyperlipidemia, pituitary adenoma s/p resection, and long-standing tobacco abuse, who presented on  12/14/2018 for evaluation of sudden onset of chest pain and was found to have an NSTEMI.  Assessment & Plan    NSTEMI - Troponin peaked at 14.49.  - Initial EKG in the ED showed inferior Q waves and 41mm upsloping ST elevation in lead III. - Repeat EKG this morning showed ST elevations in all inferior leads (II, III, and aVF). - Patient continues to report mild chest pain that she ranks as 2/10 on the pain scale. - Continue IV Heparin and IV Nitroglycerin.  - Patient takes daily Plavix at home although for unclear indication. - Aspirin not initiated due to previous allergic reaction with hives and swelling.   - Continue statin.  - Will likely need to add low dose beta-blocker following catheterization. Patient has been mildly bradycardic at times.  - Echo pending. - Patient is scheduled for a left heart catheterization later today. Orders placed. The patient understands that risks include but are not limited to stroke (1 in 1000), death (1 in 24), kidney failure [usually temporary] (1 in 500), bleeding (1 in 200), allergic reaction [possibly serious] (1 in 200), and agrees to proceed.   Hyperlipidemia - Take Pravastatin 20mg  daily at home. This was discontinued at admission in favor of high-intensity statin. - Lipid panel this admission: Cholesterol 205, Triglycerides 210, HDL 39, LDL 124.  - Continue Lipitor 80mg  daily.   History of Pituitary Adenoma s/p Resection - Will continue home medications: Prednisone, Desmopressin, and Synthroid.   Tobacco Abuse - Complete cessation is advised.   For questions or updates, please contact [place] Please consult [email or web address] for contact info under        Signed, Darreld Mclean, PA-C  12/15/2018, 7:18 AM      Patient seen and examined. Agree with assessment and plan.  Bridget Navarro is a 68 year old female who has a 48-year history of tobacco use, a history of hyperlipidemia, history of prior pituitary adenoma, status post resection  in 1991, and developed new onset severe substernal chest discomfort  awaking her from sleep on 12/14/2018.  She presented last evening to the emergency room.  Initial ECG showed mild inferior upsloping ST changes  Initial troponin 12.4.  She was evaluated by the fellow and felt to have a NSTEM. In ER she was started on low-dose nitroglycerin infusion and apparently chest pain resolved and in the ER nitroglycerin later was discontinued.  Presently on 4 E., she has mild residual chest discomfort.  I have reinstituted IV nitroglycerin  at 10 ug.  ECG this am shows small Q wave in lead III with 1 mm ST elevation in the inferior leads. Subsequent troponin 14.49 and 7.47. Suspect RCA culprit vessel.  I have discussed need for cardiac catheterization. I have reviewed the risks, indications, and alternatives to cardiac catheterization, possible angioplasty, and stenting with the patient. Risks include but are not limited to bleeding, infection, vascular injury, stroke, myocardial infection, arrhythmia, kidney injury, radiation-related injury in the case of prolonged fluoroscopy use, emergency cardiac surgery, and death. The patient understands the risks of serious complication is 1-2 in 9628 with diagnostic cardiac cath and 1-2% or less with angioplasty/stenting.  Patient agrees to undergo the procedure.  She will be worked in as the next case.  She has a very poor right radial pulse.  Femoral access may be necessary.   Troy Sine, MD, [facility] 12/15/2018 8:22 AM

## 2018-12-15 NOTE — Progress Notes (Signed)
Call R.N. in E.R. for report.

## 2018-12-15 NOTE — Progress Notes (Signed)
Patient refused the Lipitor, states she can't take it.

## 2018-12-15 NOTE — Progress Notes (Signed)
Patient arrived via stretcher alert and orin. With no complaints. Orin. To room and R.N. into do do assessment.

## 2018-12-15 NOTE — Interval H&P Note (Signed)
History and Physical Interval Note:  12/15/2018 8:41 AM  Bridget Navarro  has presented today for cardiac catheterization, with the diagnosis of NSTEMI  The various methods of treatment have been discussed with the patient and family. After consideration of risks, benefits and other options for treatment, the patient has consented to  Procedure(s): LEFT HEART CATH AND CORONARY ANGIOGRAPHY (N/A) as a surgical intervention .  The patient's history has been reviewed, patient examined, no change in status, stable for surgery.  I have reviewed the patient's chart and labs.  Questions were answered to the patient's satisfaction.    Cath Lab Visit (complete for each Cath Lab visit)  Clinical Evaluation Leading to the Procedure:   ACS: Yes.    Non-ACS:  N/A  Bridget Navarro

## 2018-12-15 NOTE — Progress Notes (Signed)
[place] for Heparin Indication: chest pain/ACS  Allergies  Allergen Reactions  . Aspirin Anaphylaxis, Shortness Of Breath and Swelling    Body and throat both swell  . Tape Hives, Rash and Other (See Comments)    NO CLEAR, "PLASTIC" TAPE!!! SKIN PEELED OFF AND THIS CAUSED BLISTERS!!!  . Codeine Itching and Other (See Comments)    Severe itching and makes the patient "hyper"  . Lortab [Hydrocodone-Acetaminophen] Itching and Other (See Comments)    Severe itching and makes the patient "hyper"  . Percocet [Oxycodone-Acetaminophen] Itching and Other (See Comments)    Severe itching and makes the patient "hyper"    Patient Measurements: Height: 5\' 3"  (160 cm) Weight: 152 lb 12.8 oz (69.3 kg)(scale A) IBW/kg (Calculated) : 52.4 Heparin Dosing Weight: 62.8 kg  Vital Signs: Temp: 98.1 F (36.7 C) (01/02 0059) Temp Source: Oral (01/02 0059) BP: 131/56 (01/02 0059) Pulse Rate: 63 (01/02 0059)  Labs: Recent Labs    12/14/18 1839 12/14/18 2047 12/14/18 2318 12/15/18 0306  HGB 15.8*  --   --   --   HCT 46.9*  --   --   --   PLT 325  --   --   --   APTT  --  31  --   --   LABPROT  --  12.3 12.6  --   INR  --  0.93 0.95  --   HEPARINUNFRC  --   --   --  0.45  CREATININE 0.87  --  0.85  --   TROPONINI 12.43*  --  14.49*  --     Estimated Creatinine Clearance: 60.8 mL/min (by C-G formula based on SCr of 0.85 mg/dL).   Medical History: Past Medical History:  Diagnosis Date  . Chronic back pain   . Fibromyalgia   . High cholesterol   . Hypertension   . Hypothyroidism   . NSTEMI (non-ST elevated myocardial infarction) ([place]) 12/14/2018  . Pituitary adenoma ([place])   . Pneumonia 1959; 2005   Assessment: 68 yo F presented to ED with CP, + L arm radiation, since last PM. Elevated troponin.   Heparin level this AM is therapeutic   Goal of Therapy:  Heparin level 0.3-0.7 units/ml Monitor platelets by anticoagulation protocol: Yes    Plan:  Cont heparin at 800 units/hr Confirmatory heparin level in 6-8 horus  Narda Bonds, PharmD, [place] Pharmacist Phone: [phone]

## 2018-12-15 NOTE — Progress Notes (Signed)
[facility] BAND REMOVAL  LOCATION:    right radial  DEFLATED PER PROTOCOL:    Yes.    TIME BAND OFF / DRESSING APPLIED:    1500    SITE UPON ARRIVAL:    Level 1, bruised   SITE AFTER BAND REMOVAL:    Level 0  CIRCULATION SENSATION AND MOVEMENT:    Within Normal Limits   Yes.    COMMENTS:

## 2018-12-15 NOTE — Plan of Care (Signed)
  Problem: Education: Goal: Knowledge of General Education information will improve Description Including pain rating scale, medication(s)/side effects and non-pharmacologic comfort measures Outcome: Not Progressing   Problem: Cardiac: Goal: Ability to achieve and maintain adequate cardiovascular perfusion will improve Outcome: Not Progressing

## 2018-12-15 NOTE — Brief Op Note (Addendum)
BRIEF CARDIAC CATHETERIZATION NOTE  DATE: 12/15/2018 TIME: 10:47 AM  PATIENT:  Bridget Navarro  68 y.o. female  PRE-OPERATIVE DIAGNOSIS:  NSTEMI  POST-OPERATIVE DIAGNOSIS:  NSTEMI  PROCEDURE:  Procedure(s): LEFT HEART CATH AND CORONARY ANGIOGRAPHY (N/A) Ultrasound Guidance For Vascular Access CORONARY STENT INTERVENTION (N/A)  SURGEON:  Surgeon(s) and Role:    * Quamere Mussell, MD - Primary  FINDINGS: 1. 3-vessel CAD, including 50-70% proximal-mid LAD disease, occluded mid LCx (likely chronic as lesion could not be crossed with guidewire), and 95% mid RCA stenosis involving RV marginal with TIMI-1 flow. 2. Mildly reduced LVEF with mildly elevated LVEDP. 3. Successful PCI to mid RCA using Synergy 2.5 x 32 mm DES with 0% residual stenosis and TIMI-3 flow. 4. Successful PTCA to osium of RV marginal branch with reduction in stenosis from 70% to 30%. 5. Unsuccessful attempt to cross mid LCx occlusion, suggesting chronic lesion.  RECOMMENDATIONS: 1. Indefinite antiplatelet therapy with ticagrelor, given aspirin allergy. 2. Medical therapy of LAD and LCx disease.  Could consider PCI to LAD if symptoms recur. 3. Aggressive secondary prevention.  Nelva Bush, MD [facility] Pager: [phone]

## 2018-12-16 ENCOUNTER — Encounter (HOSPITAL_COMMUNITY): Payer: Self-pay | Admitting: Internal Medicine

## 2018-12-16 DIAGNOSIS — I2510 Atherosclerotic heart disease of native coronary artery without angina pectoris: Secondary | ICD-10-CM

## 2018-12-16 LAB — CBC
HCT: 39.2 % (ref 36.0–46.0)
Hemoglobin: 13.3 g/dL (ref 12.0–15.0)
MCH: 29.8 pg (ref 26.0–34.0)
MCHC: 33.9 g/dL (ref 30.0–36.0)
MCV: 87.9 fL (ref 80.0–100.0)
Platelets: 270 10*3/uL (ref 150–400)
RBC: 4.46 MIL/uL (ref 3.87–5.11)
RDW: 12.9 % (ref 11.5–15.5)
WBC: 11 10*3/uL — ABNORMAL HIGH (ref 4.0–10.5)
nRBC: 0 % (ref 0.0–0.2)

## 2018-12-16 LAB — BASIC METABOLIC PANEL
Anion gap: 8 (ref 5–15)
BUN: 11 mg/dL (ref 8–23)
CO2: 23 mmol/L (ref 22–32)
Calcium: 8.5 mg/dL — ABNORMAL LOW (ref 8.9–10.3)
Chloride: 99 mmol/L (ref 98–111)
Creatinine, Ser: 0.93 mg/dL (ref 0.44–1.00)
GFR calc Af Amer: >60 mL/min (ref >60)
GFR calc non Af Amer: >60 mL/min (ref >60)
GLUCOSE: 92 mg/dL (ref 70–99)
Potassium: 4.6 mmol/L (ref 3.5–5.1)
Sodium: 130 mmol/L — ABNORMAL LOW (ref 135–145)

## 2018-12-16 MED ORDER — ISOSORBIDE MONONITRATE ER 30 MG PO TB24: 15.0000 mg | ORAL_TABLET | Freq: Every day | ORAL | 0 refills | Status: AC

## 2018-12-16 MED ORDER — ROSUVASTATIN CALCIUM 20 MG PO TABS: 20.0000 mg | ORAL_TABLET | Freq: Every day | ORAL | Status: DC

## 2018-12-16 MED ORDER — METOPROLOL TARTRATE 12.5 MG HALF TABLET
12.5000 mg | ORAL_TABLET | Freq: Two times a day (BID) | ORAL | Status: DC
  Administered 2018-12-16: 12.5 mg via ORAL
  Filled 2018-12-16: qty 1

## 2018-12-16 MED ORDER — ISOSORBIDE MONONITRATE ER 30 MG PO TB24
15.0000 mg | ORAL_TABLET | Freq: Every day | ORAL | Status: DC
  Administered 2018-12-16: 15 mg via ORAL
  Filled 2018-12-16: qty 1

## 2018-12-16 MED ORDER — TICAGRELOR 90 MG PO TABS: 90.0000 mg | ORAL_TABLET | Freq: Two times a day (BID) | ORAL | 0 refills | Status: AC

## 2018-12-16 MED ORDER — METOPROLOL TARTRATE 25 MG PO TABS: 12.5000 mg | ORAL_TABLET | Freq: Two times a day (BID) | ORAL | 0 refills | Status: AC

## 2018-12-16 MED ORDER — ROSUVASTATIN CALCIUM 20 MG PO TABS: 20.0000 mg | ORAL_TABLET | Freq: Every day | ORAL | 0 refills | Status: DC

## 2018-12-16 MED ORDER — NITROGLYCERIN 0.4 MG SL SUBL: 0.4000 mg | SUBLINGUAL_TABLET | SUBLINGUAL | 0 refills | Status: AC | PRN

## 2018-12-16 MED ORDER — PRAVASTATIN SODIUM 80 MG PO TABS: 80.0000 mg | ORAL_TABLET | Freq: Every day | ORAL | 0 refills | Status: AC

## 2018-12-16 MED FILL — BRILINTA 90 MG TABLET: 90 | 30 days supply | Qty: 60 | Fill #0

## 2018-12-16 MED FILL — NITROGLYCERIN 0.4 MG TAB SL: 0.4 | 8 days supply | Qty: 25 | Fill #0

## 2018-12-16 MED FILL — METOPROLOL TARTRATE 25 MG T: 25 | 60 days supply | Qty: 60 | Fill #0

## 2018-12-16 MED FILL — ISOSORBIDE MN ER 30 MG TAB: 30 | 60 days supply | Qty: 30 | Fill #0

## 2018-12-16 MED FILL — Verapamil HCl IV Soln 2.5 MG/ML: INTRAVENOUS | Qty: 2 | Status: AC

## 2018-12-16 NOTE — Care Management (Signed)
#   3.  S/W   Robinette Haines   @   [employer] # [phone]   BRILINTA  90 MG BID COVER- YES CO-PAY- $ 45.00 TIER- 3 DRUG PRIOR APPROVAL- NO  PREFERRED PHARMACY : [facility]

## 2018-12-16 NOTE — Discharge Instructions (Signed)
Post NSTEMI: NO HEAVY LIFTING X 2 WEEKS. NO SEXUAL ACTIVITY X 2 WEEKS. NO DRIVING X 1 WEEK. NO SOAKING BATHS, HOT TUBS, POOLS, ETC., X 7 DAYS.  Radial Site Care: Refer to this sheet in the next few weeks. These instructions provide you with information on caring for yourself after your procedure. Your caregiver may also give you more specific instructions. Your treatment has been planned according to current medical practices, but problems sometimes occur. Call your caregiver if you have any problems or questions after your procedure. HOME CARE INSTRUCTIONS  You may shower the day after the procedure.Remove the bandage (dressing) and gently wash the site with plain soap and water.Gently pat the site dry.   Do not apply powder or lotion to the site.   Do not submerge the affected site in water for 3 to 5 days.   Inspect the site at least twice daily.   Do not flex or bend the affected arm for 24 hours.   No lifting over 5 pounds (2.3 kg) for 5 days after your procedure.   Do not drive home if you are discharged the same day of the procedure. Have someone else drive you.  What to expect:  Any bruising will usually fade within 1 to 2 weeks.   Blood that collects in the tissue (hematoma) may be painful to the touch. It should usually decrease in size and tenderness within 1 to 2 weeks.  SEEK IMMEDIATE MEDICAL CARE IF:  You have unusual pain at the radial site.   You have redness, warmth, swelling, or pain at the radial site.   You have drainage (other than a small amount of blood on the dressing).   You have chills.   You have a fever or persistent symptoms for more than 72 hours.   You have a fever and your symptoms suddenly get worse.   Your arm becomes pale, cool, tingly, or numb.   You have heavy bleeding from the site. Hold pressure on the site.   **PLEASE REMEMBER TO BRING ALL OF YOUR MEDICATIONS TO EACH OF YOUR FOLLOW-UP OFFICE VISITS.

## 2018-12-16 NOTE — Discharge Summary (Signed)
Discharge Summary    Patient ID: Bridget Navarro MRN: [id number]; DOB: [birth year]  Admit date: 12/14/2018 Discharge date: 12/16/2018  Primary Care Provider: Tamsen Roers, MD  Primary Cardiologist: Shelva Majestic, MD  Primary Electrophysiologist:  None   Discharge Diagnoses    Principal Problem:   NSTEMI (non-ST elevated myocardial infarction) [facility]) Active Problems:   Tobacco abuse   Hyperlipidemia   Pituitary disease ([place])   CAD (coronary artery disease)   Allergies Allergies  Allergen Reactions  . Aspirin Anaphylaxis, Shortness Of Breath and Swelling    Body and throat both swell  . Tape Hives, Rash and Other (See Comments)    NO CLEAR, "PLASTIC" TAPE!!! SKIN PEELED OFF AND THIS CAUSED BLISTERS!!!  . Codeine Itching and Other (See Comments)    Severe itching and makes the patient "hyper"  . Lortab [Hydrocodone-Acetaminophen] Itching and Other (See Comments)    Severe itching and makes the patient "hyper"  . Percocet [Oxycodone-Acetaminophen] Itching and Other (See Comments)    Severe itching and makes the patient "hyper"    Diagnostic Studies/Procedures    Left Heart Catheterization 12/15/2018: Conclusions: 1. Three-vessel CAD, including 50-70% proximal-mid LAD disease, occluded mid LCx (likely chronic as lesion could not be crossed with guidewire), and 95% mid/distal RCA stenosis involving RV marginal with TIMI-1 flow. 2. Mildly reduced left ventricular contraction with mildly elevated filling pressure. 3. Successful PCI to mid RCA using Synergy 2.5 x 32 mm drug-eluting stent (post-dilated to 2.8 mm) with 0% residual stenosis and TIMI-3 flow. 4. Successful PTCA to osium of RV marginal branch using Sapphire 2.0 x 12 mm balloon with reduction in stenosis from 70% to 30%. 5. Unsuccessful attempt to cross mid LCx occlusion, suggesting chronic lesion.  Recommendations: 1. Indefinite antiplatelet therapy with ticagrelor, given aspirin allergy. 2. Medical therapy of LAD and LCx  disease. Could consider PCI to LAD if symptoms recur. 3. Aggressive secondary prevention. _____________  Echocardiogram 12/15/2018: Study Conclusions: - Left ventricle: The cavity size was normal. Wall thickness was  normal. Systolic function was normal. The estimated ejection  fraction was in the range of 55% to 60%. Wall motion was normal;  there were no regional wall motion abnormalities. Doppler  parameters are consistent with abnormal left ventricular  relaxation (grade 1 diastolic dysfunction).  - Mitral valve: There was mild regurgitation. Valve area by  pressure half-time: 2.24 cm^2.  Impressions:  - Normal LV systolic function; mild diastolic dysfunction; mild MR.    History of Present Illness     Ms. Bridget Navarro is a 68 year old Caucasian with a history of hyperlipidemia, pituitary adenoma s/p resection, and long-standing tobacco abuse, who presented to the [facility] ED on 12/14/2018 for evaluation of sudden onset of chest pain.   Patient reports she has been in her usual state of health until she was woken from sleep at 2:20am on the morning of 12/14/2018 with >10/10 chest pressure. Patient reports it felt like someone was sitting on her chest. She denies any prior episodes of chest pain. No shortness of breath but does report diaphoresis, nausea and dizziness. She felt so unwell that she didn't think she could get out of bed. Pain eased off some during the day but due to ongoing 7/10 pain, she presented to a Urgent Care for further evaluation. At the Urgent Care, ECG showed inferior Q waves and thus she was sent to the [facility] ED.   Upon arrival, the patient was hemodynamically stable with HR 70, BP 105/70, chest pain 7/10 in  intensity. ECG again showed inferior Q waves and sub 67mm, upsloping STE in lead III only. Initial troponin elevated at 12.4. She was started on IV Heparin drip and IV Nitroglycerin. She was not given aspirin due to a history of hives and significant swelling that  the patient reports with prior administration. At the time of initial interview with Cardiology,  patient reports that chest pain is significantly improved and is at 2/10 in intensity on 5 mcg Nitro drip. She denies palpitations, shortness of breath, orthopnea, PND, lower extremity edema, or syncope. Patient was admitted for further evaluation and management of NSTEMI.  Hospital Course     Consultants: None.  NSTEMI Patient was admitted on 12/14/2018 for NSTEMI as stated above. Troponin peaked at 14.49. Left heart catheterization showed 3-vessel CAD with 50-70% proximal-mid LAD disease, occluded mid LCx (likely chronic as lesion could not be grossed with guidewire), and 95% mid-distal RCA involving RV marginal. Patient underwent successful PCI with DES to mid RCA lesion with 0% residual stenosis and TIMI-3 flow. Patient also underwent successful PTCA to osium of RV marginal branch using balloon with reduction in stenosis from 70% to 30%. Echocardiogram was also performed which showed LVEF of 55-60% with no regional wall motion abnormalities, grade 1 diastolic dysfunction, and mild mitral regurgitation. Patient tolerated the procedure well. She is doing well today and denies any further chest pain.  - Continue Brilinta 90mg  twice daily. Patient has history of anaphylaxis to Aspirin so this will not be added.  - Continue high-intensity statin.  - Will add Lopressor 12.5mg  twice daily at discharge. Per review of telemetry, heart rates have ranges from the mid 50's (mostly while resting) to the low 100's.  - Will also add Imdur 15mg  daily at discharge.   Hyperlipidemia - Lipid panel this admission: Cholesterol 205, Triglycerides 210, HDL 39, LDL 124.  - Patient on Pravastatin 20mg  daily at home. She was transitioned to Lipitor 80mg  daily on admission in favor of a high-intensity statin but has refused this. However, she has agreed to try Crestor 20mg  at time of discharge.  - Will need repeat lipid panel  and CMP in 6 weeks after transitioning to high-intensity statin.   MD to see prior to discharge. Outpatient follow-up has been arranged. Medications as below.  Discharge Vitals Blood pressure 110/61, pulse 65, temperature 97.6 F (36.4 C), temperature source Oral, resp. rate 17, height 5\' 3"  (1.6 m), weight 70.2 kg, SpO2 97 %.  Filed Weights   12/14/18 1831 12/14/18 2239 12/16/18 0639  Weight: 69.9 kg 69.3 kg 70.2 kg   General: Well developed, well nourished, female in no acute distress Head:  Normocephalic and atraumatic. Eye: Sclera clear. No xanthomas.  Lungs: No increased work of breathing. Clear bilaterally to auscultation. No wheezes, rhonchi, or rales.  Heart: RRR. No murmurs, gallops, or rubs. Soft radial pulses bilaterally. Right radial cath site soft with no signs of hematoma.  Abdomen: Abdomen soft, non-distended, and non-tender to palpation. Bowel sounds present. Msk: Normal strength and tone for age. Extremities: No lower extremity edema.  Skin: Warm and dry.  Neuro: Alert and oriented  X3. No focal deficits. Psych:  Normal affect. Responds appropriately.  Labs & Radiologic Studies    CBC Recent Labs    12/15/18 0439 12/16/18 0117  WBC 10.5 11.0*  HGB 14.8 13.3  HCT 43.9 39.2  MCV 88.0 87.9  PLT 264 275   Basic Metabolic Panel Recent Labs    12/14/18 2318 12/15/18 0439 12/16/18 0117  NA 133* 133* 130*  K 3.9 3.8 4.6  CL 99 102 99  CO2 21* 19* 23  GLUCOSE 91 83 92  BUN 16 12 11   CREATININE 0.85 0.90 0.93  CALCIUM 8.6* 8.6* 8.5*  MG 2.0  --   --    Liver Function Tests Recent Labs    12/14/18 2318  AST 49*  ALT 14  ALKPHOS 75  BILITOT 0.4  PROT 6.5  ALBUMIN 3.8   No results for input(s): LIPASE, AMYLASE in the last 72 hours. Cardiac Enzymes Recent Labs    12/14/18 2318 12/15/18 0439 12/15/18 1155  TROPONINI 14.49* 7.47* 5.83*   BNP Invalid input(s): POCBNP D-Dimer No results for input(s): DDIMER in the last 72 hours. Hemoglobin  A1C Recent Labs    12/14/18 2318  HGBA1C 5.2   Fasting Lipid Panel Recent Labs    12/15/18 0306  CHOL 205*  HDL 39*  LDLCALC 124*  TRIG 210*  CHOLHDL 5.3   Thyroid Function Tests Recent Labs    12/14/18 2318  TSH <0.010*   _____________  Dg Chest 2 View  Result Date: 12/14/2018 CLINICAL DATA:  Chest pain EXAM: CHEST - 2 VIEW COMPARISON:  None. FINDINGS: Normal heart size. Normal mediastinal contour. No pneumothorax. No pleural effusion. No pulmonary edema. Minimal scarring at anterior left lung base. No acute consolidative airspace disease. IMPRESSION: No active cardiopulmonary disease. Electronically Signed   By: Ilona Sorrel M.D.   On: 12/14/2018 19:34   Disposition   Patient is being discharged home today in good condition.  Follow-up Plans & Appointments    Follow-up Information    [facility] Follow up.   Specialty:  Cardiology Why:  You have a hospital follow-up visit scheduled for 12/26/2018 at 8:30am with Jory Sims, one of Dr. Evette Georges Nurse Practitioners. Contact information: [place] [phone]         Discharge Instructions    Amb Referral to Cardiac Rehabilitation   Complete by:  As directed    Diagnosis:   Coronary Stents NSTEMI     Diet - low sodium heart healthy   Complete by:  As directed    Increase activity slowly   Complete by:  As directed       Discharge Medications   Allergies as of 12/16/2018      Reactions   Aspirin Anaphylaxis, Shortness Of Breath, Swelling   Body and throat both swell   Tape Hives, Rash, Other (See Comments)   NO CLEAR, "PLASTIC" TAPE!!! SKIN PEELED OFF AND THIS CAUSED BLISTERS!!!   Codeine Itching, Other (See Comments)   Severe itching and makes the patient "hyper"   Lortab [hydrocodone-acetaminophen] Itching, Other (See Comments)   Severe itching and makes the patient "hyper"   Percocet [oxycodone-acetaminophen] Itching, Other (See  Comments)   Severe itching and makes the patient "hyper"      Medication List    STOP taking these medications   clopidogrel 75 MG tablet Commonly known as:  PLAVIX   diclofenac 75 MG EC tablet Commonly known as:  VOLTAREN   fexofenadine-pseudoephedrine 60-120 MG 12 hr tablet Commonly known as:  ALLEGRA-D   pravastatin 20 MG tablet Commonly known as:  PRAVACHOL     TAKE these medications   Cranberry 425 MG Caps Take 425 mg by mouth daily.   desmopressin 0.1 MG tablet Commonly known as:  DDAVP Take 0.1 mg by mouth 2 (two) times daily.   isosorbide mononitrate 30 MG  24 hr tablet Commonly known as:  IMDUR Take 0.5 tablets (15 mg total) by mouth daily.   levothyroxine 137 MCG tablet Commonly known as:  SYNTHROID, LEVOTHROID Take 68.5-137 mcg by mouth See admin instructions. Take 137 mcg by mouth before breakfast on Sun/Tues/Wed/Thurs/Fri/Sat and 68.5 mcg on Mon   Melatonin 10 MG Tabs Take 20 mg by mouth at bedtime.   metoprolol tartrate 25 MG tablet Commonly known as:  LOPRESSOR Take 0.5 tablets (12.5 mg total) by mouth 2 (two) times daily.   nitroGLYCERIN 0.4 MG SL tablet Commonly known as:  NITROSTAT Place 1 tablet (0.4 mg total) under the tongue every 5 (five) minutes as needed for chest pain (CP or SOB).   predniSONE 1 MG tablet Commonly known as:  DELTASONE Take 4 mg by mouth daily with breakfast.   rosuvastatin 20 MG tablet Commonly known as:  CRESTOR Take 1 tablet (20 mg total) by mouth daily at 6 PM.   ticagrelor 90 MG Tabs tablet Commonly known as:  BRILINTA Take 1 tablet (90 mg total) by mouth 2 (two) times daily.   Vitamin B12 1000 MCG Tbcr Take 1,000 mcg by mouth daily.        Acute coronary syndrome (MI, NSTEMI, STEMI, etc) this admission?: Yes.     AHA/ACC Clinical Performance & Quality Measures: 1. Aspirin prescribed? - No - history of anaphylaxis with Aspirin 2. ADP Receptor Inhibitor (Plavix/Clopidogrel, Brilinta/Ticagrelor or  Effient/Prasugrel) prescribed (includes medically managed patients)? - Yes 3. Beta Blocker prescribed? - Yes 4. High Intensity Statin (Lipitor 40-80mg  or Crestor 20-40mg ) prescribed? - Yes 5. EF assessed during THIS hospitalization? - Yes 6. For EF <40%, was ACEI/ARB prescribed? - Not Applicable (EF >/= 68%) 7. For EF <40%, Aldosterone Antagonist (Spironolactone or Eplerenone) prescribed? - Not Applicable (EF >/= 12%) 8. Cardiac Rehab Phase II ordered (Included Medically managed Patients)? - Yes     Outstanding Labs/Studies   Will need to recheck lipid panel and CMP in 6 weeks after transitioning to high-intensity statin.   Duration of Discharge Encounter   Greater than 30 minutes including physician time.  Signed, Darreld Mclean, PA-C 12/16/2018, 12:13 PM     Patient seen and examined. Agree with assessment and plan.  No recurrent chest pain status post PCI to the distal RCA yesterday.  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  With concomitant CAD, will add low-dose isosorbide mononitrate to her recently started beta-blocker therapy.  I discussed the importance of smoking cessation.  Aggressive lipid-lowering therapy with atorvastatin 80 mg with target LDL less than 70.  Apparently the patient has a history of anaphylaxis to aspirin.  Continue Brilinta 90 mg twice daily for minimum of a year with probable indefinite use of P2Y 12 inhibition.  Recommend medication titration as an outpatient.   Troy Sine, MD, [facility] 12/16/2018 12:13 PM

## 2018-12-16 NOTE — Progress Notes (Signed)
CARDIAC REHAB PHASE I   Pt declined ambulation due to not having breakfast. Educated Pt on MI booklet, smoking cessation, nutrition, exercise guidelines, risk factors, incision care, stent card, and [facility]. Referral sent to [facility] [place]. Brilinta use and NTG use discussed with Pt. Pt was responded well. Encouraged Pt to ambulate today.   [id number]    [place], ACSM CEP  9:06 AM 12/16/2018

## 2018-12-16 NOTE — Care Management Note (Signed)
Case Management Note  Patient Details  Name: Bridget Navarro MRN: [id number] Date of Birth: [birth year]  Subjective/Objective:    From home, s/p stent intervention, will be on brilinta, NCM informed patient of the co pay amt for her refills, and the preferred pharmacy.  She has the 30 day free coupon.                  Action/Plan: DC home when ready.   Expected Discharge Date:  12/16/18               Expected Discharge Plan:  Home/Self Care  In-House Referral:     Discharge planning Services  CM Consult, Medication Assistance  Post Acute Care Choice:    Choice offered to:     DME Arranged:    DME Agency:     HH Arranged:    HH Agency:     Status of Service:  Completed, signed off  If discussed at [employer] of Stay Meetings, dates discussed:    Additional Comments:  Zenon Mayo, RN 12/16/2018, 12:50 PM

## 2018-12-26 ENCOUNTER — Ambulatory Visit: Payer: Medicare HMO | Admitting: Adult Health

## 2018-12-28 ENCOUNTER — Telehealth (HOSPITAL_COMMUNITY): Payer: Self-pay

## 2018-12-28 ENCOUNTER — Ambulatory Visit (INDEPENDENT_AMBULATORY_CARE_PROVIDER_SITE_OTHER): Payer: Medicare HMO | Admitting: Adult Health

## 2018-12-28 ENCOUNTER — Encounter: Payer: Self-pay | Admitting: Adult Health

## 2018-12-28 VITALS — BP 110/78 | HR 86 | Ht 63.0 in | Wt 150.0 lb

## 2018-12-28 DIAGNOSIS — I214 Non-ST elevation (NSTEMI) myocardial infarction: Secondary | ICD-10-CM

## 2018-12-28 DIAGNOSIS — E7800 Pure hypercholesterolemia, unspecified: Secondary | ICD-10-CM

## 2018-12-28 DIAGNOSIS — I2510 Atherosclerotic heart disease of native coronary artery without angina pectoris: Secondary | ICD-10-CM

## 2018-12-28 DIAGNOSIS — Z720 Tobacco use: Secondary | ICD-10-CM

## 2018-12-28 NOTE — Telephone Encounter (Signed)
Attempted to call patient in regards to Cardiac Rehab - LM on VM 

## 2018-12-28 NOTE — Telephone Encounter (Signed)
Pt insurance is active and benefits verified through [facility]. Co-pay $10.00, DED $0.00/$0.00 met, out of pocket $3,400.00/$590.00 met, co-insurance 0%. No pre-authorization required. Passport, 12/28/2018 @ 10:19AM, REF# [id number]  Will contact patient to see if she is interested in the Cardiac Rehab Program. If interested, patient will need to complete follow up appt. Once completed, patient will be contacted for scheduling upon review by the RN Navigator.

## 2018-12-28 NOTE — Progress Notes (Signed)
Cardiology Office Note   Date:  12/28/2018   ID:  Bridget Navarro, DOB 1951-06-29, MRN [id number]  PCP:  Tamsen Roers, MD  Cardiologist: Dr. Claiborne Billings  Chief Complaint  Patient presents with  . Hospitalization Follow-up    TOC  . Coronary Artery Disease     History of Present Illness: Bridget Navarro is a 68 y.o. female who presents for post hospital follow up after admission for NSTEMI on 12/16/2018 after being seen in the ER for chest pain, described as someone sitting on her chest around 2 am. Troponin was elevated at 12.2. She was planned for cardiac cath.   Cath performed by Dr. Saunders Revel on 12/14/2018 revealed 3 vessel disease with 50%-70% proximal mid LAD, occluded mid L Cx (likely chronic lesion), and 95% mid-distal RCA involving RV marginal. She had successful PCI with DES to mid RCA lesion, and PTCA to the ostium of the RV marginal. Echo demonstrated normal LV function with Grade 1 diastolic dysfunction.   She was started on Brilinta, but not ASA due to anaphylaxis reaction in the past. Lopressor 12.5 mg BID, isosorbide  15 mg and high intensity statin with Crestor 20 mg.  She comes today feeling better concerning chest pain. Has had episodes of dyspnea after taking Brilinta. Denies bleeding or excessive bruising. She is moving to [place] next week and is to be established with cardiologist there. Cousin is helping her with this. She unfortunately continues to smoke.   Past Medical History:  Diagnosis Date  . Chronic back pain   . Fibromyalgia   . High cholesterol   . Hypertension   . Hypothyroidism   . NSTEMI (non-ST elevated myocardial infarction) ([place]) 12/14/2018  . Pituitary adenoma ([place])   . Pneumonia 1959; 2005    Past Surgical History:  Procedure Laterality Date  . BACK SURGERY    . BREAST CYST EXCISION Left X 2  . CARPAL TUNNEL RELEASE Left   . CORONARY BALLOON ANGIOPLASTY N/A 12/15/2018   Procedure: CORONARY BALLOON ANGIOPLASTY;  Surgeon: Nelva Bush, MD;  Location: [facility];  Service: Cardiovascular;  Laterality: N/A;  . CORONARY STENT INTERVENTION N/A 12/15/2018   Procedure: CORONARY STENT INTERVENTION;  Surgeon: Nelva Bush, MD;  Location: [facility];  Service: Cardiovascular;  Laterality: N/A;  . LEFT HEART CATH AND CORONARY ANGIOGRAPHY N/A 12/15/2018   Procedure: LEFT HEART CATH AND CORONARY ANGIOGRAPHY;  Surgeon: Nelva Bush, MD;  Location: [facility];  Service: Cardiovascular;  Laterality: N/A;  . LUMBAR [place] SURGERY  09/16/1990, 09/28/2013  . TONSILLECTOMY  1958  . TRANSPHENOIDAL PITUITARY RESECTION  09/2009   "pituitary adenoma"  . ULTRASOUND GUIDANCE FOR VASCULAR ACCESS  12/15/2018   Procedure: Ultrasound Guidance For Vascular Access;  Surgeon: Nelva Bush, MD;  Location: [facility];  Service: Cardiovascular;;     Current Outpatient Medications  Medication Sig Dispense Refill  . Cranberry 425 MG CAPS Take 425 mg by mouth daily.    . Cyanocobalamin (VITAMIN B12) 1000 MCG TBCR Take 1,000 mcg by mouth daily.     Marland Kitchen desmopressin (DDAVP) 0.1 MG tablet Take 0.1 mg by mouth 2 (two) times daily.    . isosorbide mononitrate (IMDUR) 30 MG 24 hr tablet Take 0.5 tablets (15 mg total) by mouth daily. 30 tablet 0  . levothyroxine (SYNTHROID, LEVOTHROID) 137 MCG tablet Take 68.5-137 mcg by mouth See admin instructions. Take 137 mcg by mouth before breakfast on Sun/Tues/Wed/Thurs/Fri/Sat and 68.5 mcg on Mon    . Melatonin  10 MG TABS Take 20 mg by mouth at bedtime.    . metoprolol tartrate (LOPRESSOR) 25 MG tablet Take 0.5 tablets (12.5 mg total) by mouth 2 (two) times daily. 60 tablet 0  . nitroGLYCERIN (NITROSTAT) 0.4 MG SL tablet Place 1 tablet (0.4 mg total) under the tongue every 5 (five) minutes as needed for chest pain (CP or SOB). 25 tablet 0  . pravastatin (PRAVACHOL) 80 MG tablet Take 1 tablet (80 mg total) by mouth daily. 30 tablet 0  . predniSONE (DELTASONE) 1 MG tablet Take 4 mg by mouth daily with breakfast.     . ticagrelor (BRILINTA) 90 MG TABS tablet Take 1 tablet (90 mg total) by mouth 2 (two) times daily. 60 tablet 0   No current facility-administered medications for this visit.     Allergies:   Aspirin; Tape; Codeine; Lortab [hydrocodone-acetaminophen]; and Percocet [oxycodone-acetaminophen]    Social History:  The patient  reports that she has been smoking. She has a 48.00 pack-year smoking history. She has never used smokeless tobacco. She reports current alcohol use. She reports that she does not use drugs.   Family History:  The patient's family history includes Coronary artery disease in her father and paternal grandfather.    ROS: All other systems are reviewed and negative. Unless otherwise mentioned in H&P    PHYSICAL EXAM: VS:  BP 110/78   Pulse 86   Ht 5\' 3"  (1.6 m)   Wt 150 lb (68 kg)   BMI 26.57 kg/m  , BMI Body mass index is 26.57 kg/m. GEN: Well nourished, well developed, in no acute distress HEENT: normal Neck: no JVD, carotid bruits, or masses Cardiac: RRR; no murmurs, rubs, or gallops,no edema  Respiratory:  Clear to auscultation bilaterally, raspy in the upper airways.  GI: soft, nontender, nondistended, + BS MS: no deformity or atrophy Skin: warm and dry, no rash Neuro:  Strength and sensation are intact Psych: euthymic mood, full affect   EKG: NSR rate of 86 bpm. Inferior infarct.   Recent Labs: 12/14/2018: ALT 14; Magnesium 2.0; TSH <0.010 12/16/2018: BUN 11; Creatinine, Ser 0.93; Hemoglobin 13.3; Platelets 270; Potassium 4.6; Sodium 130    Lipid Panel    Component Value Date/Time   CHOL 205 (H) 12/15/2018 0306   TRIG 210 (H) 12/15/2018 0306   HDL 39 (L) 12/15/2018 0306   CHOLHDL 5.3 12/15/2018 0306   VLDL 42 (H) 12/15/2018 0306   LDLCALC 124 (H) 12/15/2018 0306      Wt Readings from Last 3 Encounters:  12/28/18 150 lb (68 kg)  12/16/18 154 lb 12.2 oz (70.2 kg)      Other studies Reviewed: Left Heart Catheterization  12/15/2018: Conclusions: 1. Three-vessel CAD, including 50-70% proximal-mid LAD disease, occluded mid LCx (likely chronic as lesion could not be crossed with guidewire), and 95% mid/distal RCA stenosis involving RV marginal with TIMI-1 flow. 2. Mildly reduced left ventricular contraction with mildly elevated filling pressure. 3. Successful PCI to mid RCA using Synergy 2.5 x 32 mm drug-eluting stent (post-dilated to 2.8 mm) with 0% residual stenosis and TIMI-3 flow. 4. Successful PTCA to osium of RV marginal branch using Sapphire 2.0 x 12 mm balloon with reduction in stenosis from 70% to 30%. 5. Unsuccessful attempt to cross mid LCx occlusion, suggesting chronic lesion.  Recommendations: 1. Indefinite antiplatelet therapy with ticagrelor, given aspirin allergy. 2. Medical therapy of LAD and LCx disease. Could consider PCI to LAD if symptoms recur. 3. Aggressive secondary prevention. _____________  Echocardiogram 12/15/2018:  Study Conclusions: - Left ventricle: The cavity size was normal. Wall thickness was  normal. Systolic function was normal. The estimated ejection  fraction was in the range of 55% to 60%. Wall motion was normal;  there were no regional wall motion abnormalities. Doppler  parameters are consistent with abnormal left ventricular  relaxation (grade 1 diastolic dysfunction).  - Mitral valve: There was mild regurgitation. Valve area by  pressure half-time: 2.24 cm^2.  Impressions:  - Normal LV systolic function; mild diastolic dysfunction; mild MR.   Left Heart Catheterization 12/15/2018: Conclusions: 6. Three-vessel CAD, including 50-70% proximal-mid LAD disease, occluded mid LCx (likely chronic as lesion could not be crossed with guidewire), and 95% mid/distal RCA stenosis involving RV marginal with TIMI-1 flow. 7. Mildly reduced left ventricular contraction with mildly elevated filling pressure. 8. Successful PCI to mid RCA using Synergy 2.5 x 32 mm drug-eluting stent  (post-dilated to 2.8 mm) with 0% residual stenosis and TIMI-3 flow. 9. Successful PTCA to osium of RV marginal branch using Sapphire 2.0 x 12 mm balloon with reduction in stenosis from 70% to 30%. 10. Unsuccessful attempt to cross mid LCx occlusion, suggesting chronic lesion.  Recommendations: 4. Indefinite antiplatelet therapy with ticagrelor, given aspirin allergy. 5. Medical therapy of LAD and LCx disease. Could consider PCI to LAD if symptoms recur. 6. Aggressive secondary prevention.  ASSESSMENT AND PLAN:  1. CAD:  S/P NSTEMI with cardiac cath, intervention to RCA with DES and angioplasty to the ostium of the RV marginal. She remains on Brilinta but no ASA due to anaphylaxis.  She is having some temporary dyspnea after taking Brilinta. I have advised a small amount of caffeine to help with this. Should this be persistent, consider changing to Effient.  Continue  Metoprolol, isosorbide.   2. Hypercholesterolemia: Continue Pravachol as directed. Goal of LDL < 70.   3. Ongoing tobacco abuse: Smoking cessation is strongly recommended to prevent future CAD events.    Current medicines are reviewed at length with the patient today.  Patient is moving to [place] next week. I have made copies of her cath and discharge summary for her to take to her new cardiologist.   Labs/ tests ordered today include: None  Phill Myron. West Pugh, ANP, AACC   12/28/2018 12:38 PM    [address] Office [phone] Fax [phone]

## 2018-12-28 NOTE — Patient Instructions (Signed)
Follow-Up   NO FOLLOW NEEDED-[facility] IN  [place] You will need a follow up appointment in .  Please call our office 2 months in advance to schedule this appointment.  You may see Shelva Majestic, MDKathryn Purcell Nails, DNP, AACC or one of the following Advanced Practice Providers on your designated Care Team:  Almyra Deforest, PA-C  Fabian Sharp, [place]   Medication Instructions:  NO CHANGES- Your physician recommends that you continue on your current medications as directed. Please refer to the Current Medication list given to you today. If you need a refill on your cardiac medications before your next appointment, please call your pharmacy.  Labwork: When you have labs (blood work) and your tests are completely normal, you will receive your results ONLY by [place] (if you have MyChart) -OR- A paper copy in the mail.  At [facility], you and your health needs are our priority.  As part of our continuing mission to provide you with exceptional heart care, we have created designated Provider Care Teams.  These Care Teams include your primary Cardiologist (physician) and Advanced Practice Providers (APPs -  Physician Assistants and Nurse Practitioners) who all work together to provide you with the care you need, when you need it.  Thank you for choosing [facility] at [facility]!!

## 2019-02-06 ENCOUNTER — Other Ambulatory Visit: Payer: Self-pay | Admitting: Family Medicine

## 2019-02-06 ENCOUNTER — Ambulatory Visit
Admission: RE | Admit: 2019-02-06 | Discharge: 2019-02-06 | Disposition: A | Discharge status: Home / Self Care | Payer: Medicare HMO | Source: Ambulatory Visit | Attending: Family Medicine | Admitting: Family Medicine

## 2019-02-06 DIAGNOSIS — M25562 Pain in left knee: Secondary | ICD-10-CM

## 2019-02-06 DIAGNOSIS — M25511 Pain in right shoulder: Secondary | ICD-10-CM

## 2019-02-06 DIAGNOSIS — W19XXXA Unspecified fall, initial encounter: Secondary | ICD-10-CM

## 2019-11-14 DEATH — deceased

## 2020-02-20 IMAGING — CR DG CHEST 2V
2 series · 2 of 2 positions shown · non-contrast
Comparison: None.

CLINICAL DATA: Chest pain

EXAM:
CHEST - 2 VIEW

[chest pa]
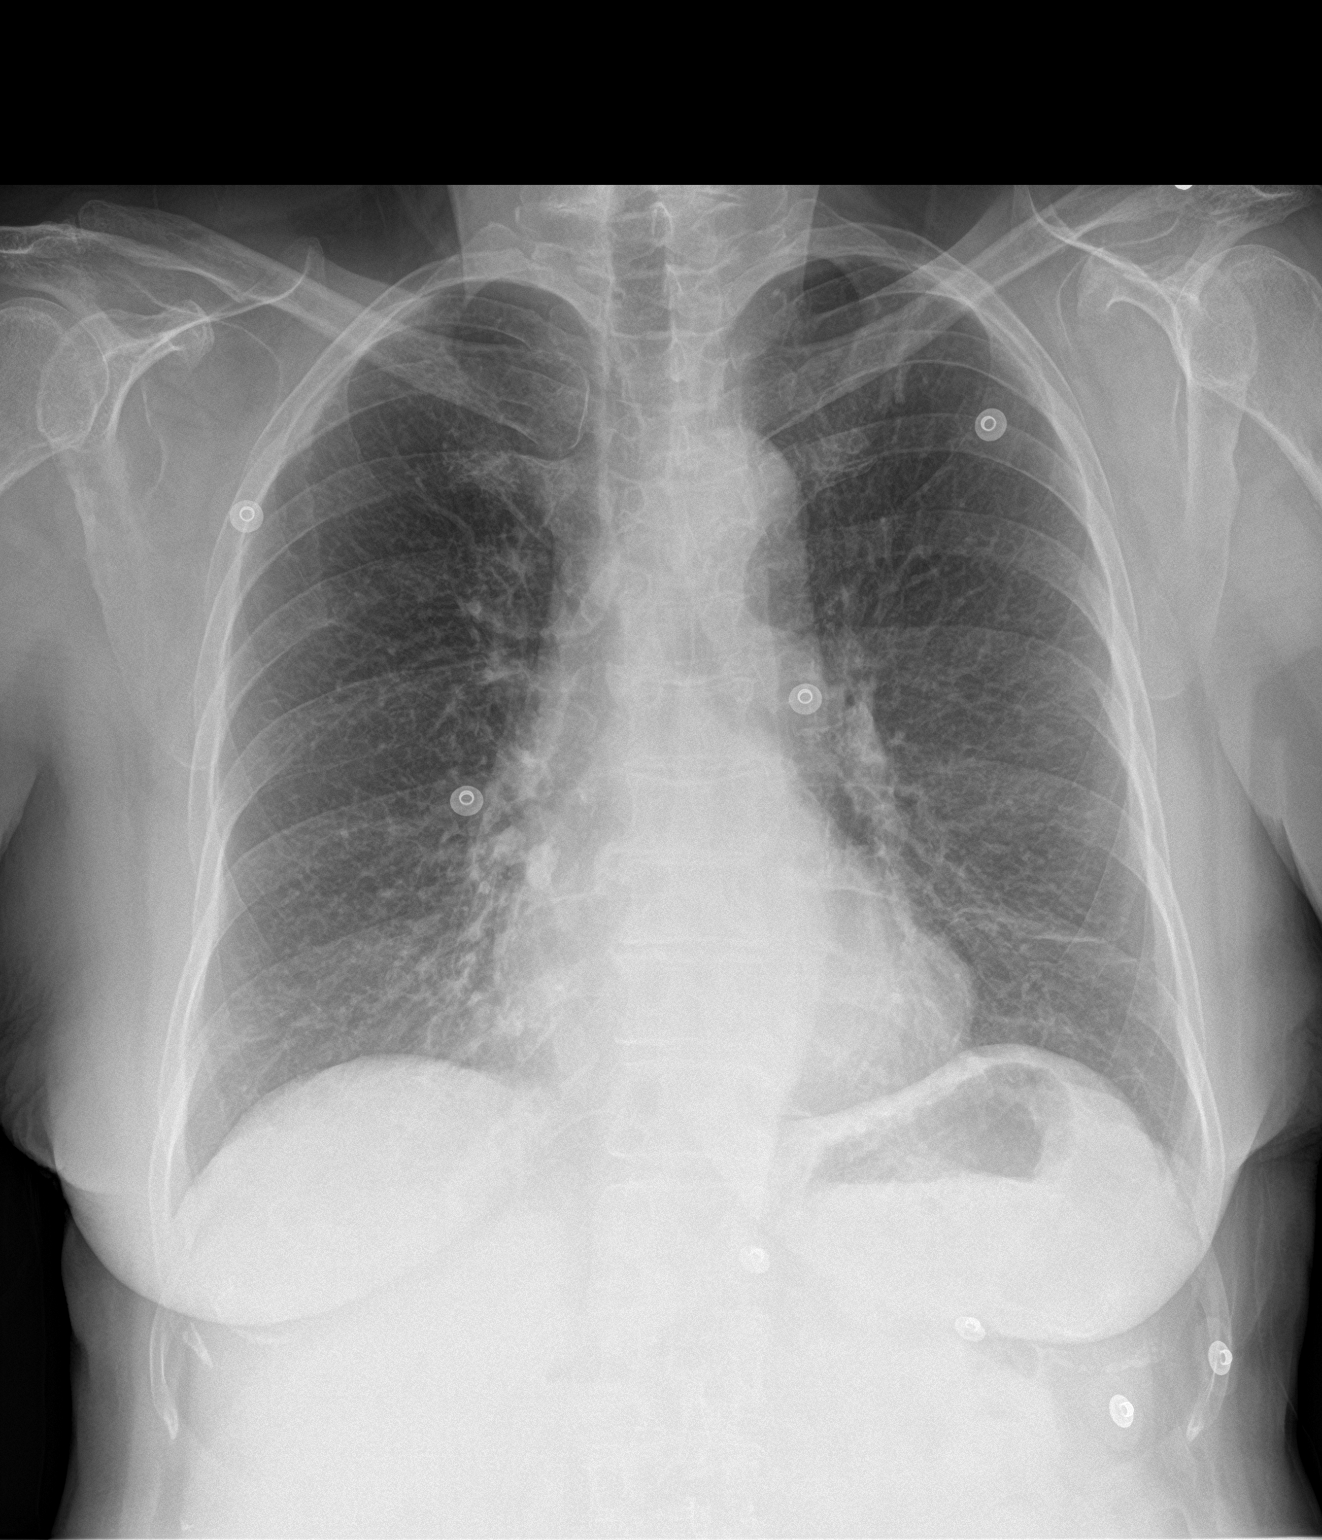

[chest lat]
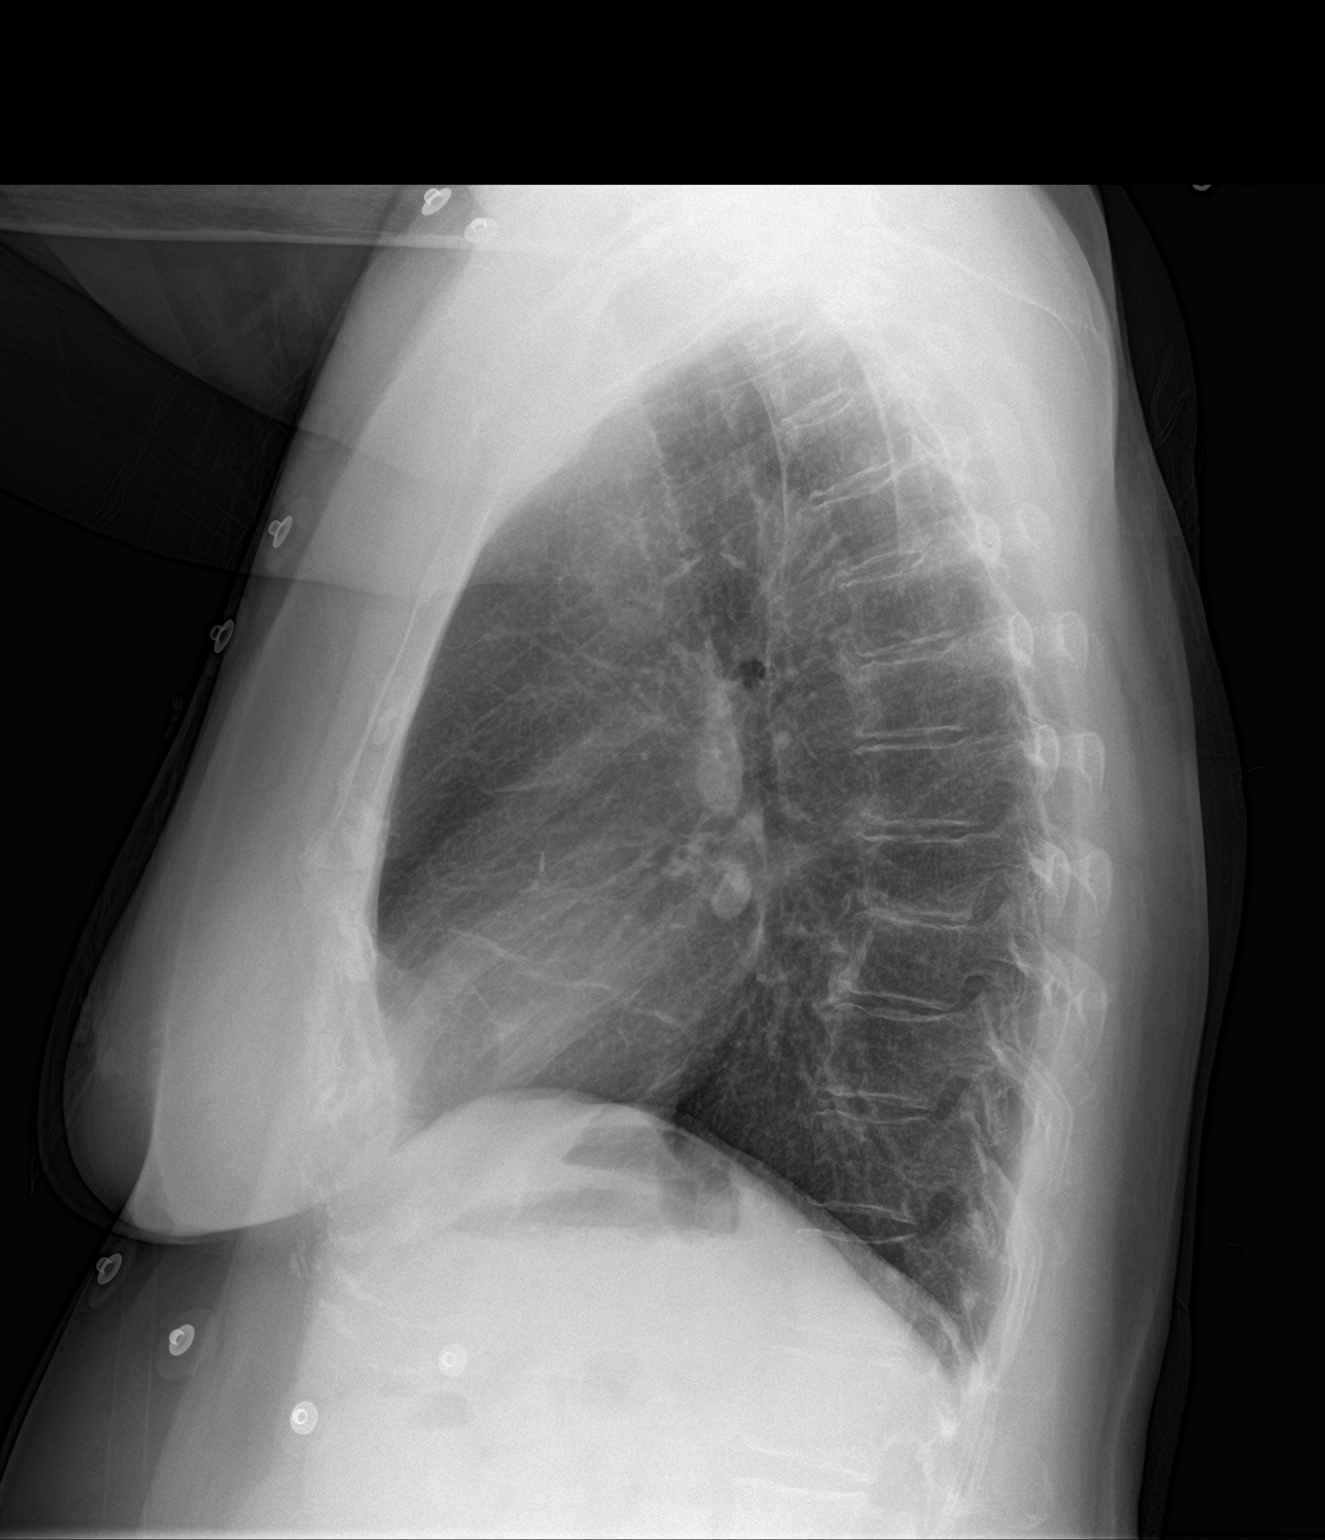

[2 of 2 positions shown; findings below may reference images not displayed]

FINDINGS: Normal heart size. Normal mediastinal contour. No pneumothorax. No
pleural effusion. No pulmonary edema. Minimal scarring at anterior
left lung base. No acute consolidative airspace disease.
IMPRESSION: No active cardiopulmonary disease.
